# Patient Record
Sex: Male | Born: 1990 | Race: Black or African American | Hispanic: No | Marital: Single | Smoking: Former smoker
Health system: Southern US, Community
[De-identification: ages and names within clinical notes are randomized; demographics above are authoritative.]

## PROBLEM LIST (undated history)

## (undated) DIAGNOSIS — F419 Anxiety disorder, unspecified: Secondary | ICD-10-CM

## (undated) DIAGNOSIS — K4090 Unilateral inguinal hernia, without obstruction or gangrene, not specified as recurrent: Secondary | ICD-10-CM

## (undated) HISTORY — PX: HERNIA REPAIR: SHX51

## (undated) HISTORY — DX: Anxiety disorder, unspecified: F41.9

## (undated) HISTORY — PX: NO PAST SURGERIES: SHX2092

---

## 2013-09-03 ENCOUNTER — Encounter (HOSPITAL_COMMUNITY): Payer: Self-pay | Admitting: Emergency Medicine

## 2013-09-03 ENCOUNTER — Emergency Department (HOSPITAL_COMMUNITY)
Admission: EM | Admit: 2013-09-03 | Discharge: 2013-09-03 | Disposition: A | Discharge status: Home / Self Care | Payer: Self-pay | Attending: Emergency Medicine | Admitting: Emergency Medicine

## 2013-09-03 DIAGNOSIS — F419 Anxiety disorder, unspecified: Secondary | ICD-10-CM

## 2013-09-03 DIAGNOSIS — F17200 Nicotine dependence, unspecified, uncomplicated: Secondary | ICD-10-CM | POA: Insufficient documentation

## 2013-09-03 DIAGNOSIS — F411 Generalized anxiety disorder: Secondary | ICD-10-CM | POA: Insufficient documentation

## 2013-09-03 NOTE — ED Notes (Signed)
Pt calm and pleasant, verbalizes understanding of follow up with community  resources

## 2013-09-03 NOTE — ED Notes (Signed)
Pt recently given risperdal for anxiety.  Anxiety continues.

## 2013-09-03 NOTE — Discharge Instructions (Signed)
Stop your medication. Avoid any drugs or using medications off the street. Please follow up with psychiatrist, list provided.    Panic Attacks Panic attacks are sudden, short-livedsurges of severe anxiety, fear, or discomfort. They may occur for no reason when you are relaxed, when you are anxious, or when you are sleeping. Panic attacks may occur for a number of reasons:   Healthy people occasionally have panic attacks in extreme, life-threatening situations, such as war or natural disasters. Normal anxiety is a protective mechanism of the body that helps us react to danger (fight or flight response).  Panic attacks are often seen with anxiety disorders, such as panic disorder, social anxiety disorder, generalized anxiety disorder, and phobias. Anxiety disorders cause excessive or uncontrollable anxiety. They may interfere with your relationships or other life activities.  Panic attacks are sometimes seen with other mental illnesses, such as depression and posttraumatic stress disorder.  Certain medical conditions, prescription medicines, and drugs of abuse can cause panic attacks. SYMPTOMS  Panic attacks start suddenly, peak within 20 minutes, and are accompanied by four or more of the following symptoms:  Pounding heart or fast heart rate (palpitations).  Sweating.  Trembling or shaking.  Shortness of breath or feeling smothered.  Feeling choked.  Chest pain or discomfort.  Nausea or strange feeling in your stomach.  Dizziness, light-headedness, or feeling like you will faint.  Chills or hot flushes.  Numbness or tingling in your lips or hands and feet.  Feeling that things are not real or feeling that you are not yourself.  Fear of losing control or going crazy.  Fear of dying. Some of these symptoms can mimic serious medical conditions. For example, you may think you are having a heart attack. Although panic attacks can be very scary, they are not life  threatening. DIAGNOSIS  Panic attacks are diagnosed through an assessment by your health care provider. Your health care provider will ask questions about your symptoms, such as where and when they occurred. Your health care provider will also ask about your medical history and use of alcohol and drugs, including prescription medicines. Your health care provider may order blood tests or other studies to rule out a serious medical condition. Your health care provider may refer you to a mental health professional for further evaluation. TREATMENT   Most healthy people who have one or two panic attacks in an extreme, life-threatening situation will not require treatment.  The treatment for panic attacks associated with anxiety disorders or other mental illness typically involves counseling with a mental health professional, medicine, or a combination of both. Your health care provider will help determine what treatment is best for you.  Panic attacks due to physical illness usually go away with treatment of the illness. If prescription medicine is causing panic attacks, talk with your health care provider about stopping the medicine, decreasing the dose, or substituting another medicine.  Panic attacks due to alcohol or drug abuse go away with abstinence. Some adults need professional help in order to stop drinking or using drugs. HOME CARE INSTRUCTIONS   Take all medicines as directed by your health care provider.   Schedule and attend follow-up visits as directed by your health care provider. It is important to keep all your appointments. SEEK MEDICAL CARE IF:  You are not able to take your medicines as prescribed.  Your symptoms do not improve or get worse. SEEK IMMEDIATE MEDICAL CARE IF:   You experience panic attack symptoms that are different than  your usual symptoms.  You have serious thoughts about hurting yourself or others.  You are taking medicine for panic attacks and have a  serious side effect. MAKE SURE YOU:  Understand these instructions.  Will watch your condition.  Will get help right away if you are not doing well or get worse. Document Released: 01/02/2005 Document Revised: 01/07/2013 Document Reviewed: 08/16/2012 [facility] Patient Information 2015 [place], [place]. This information is not intended to replace advice given to you by your health care provider. Make sure you discuss any questions you have with your health care provider.

## 2013-09-03 NOTE — ED Provider Notes (Signed)
CSN: [id number]     Arrival date & time 09/03/13  1626 History  This chart was scribed for non-physician practitioner, Jaynie Crumbleatyana Natally Ribera, PA-C working with Merrie RoofJohn David Wofford III, MD by Greggory StallionKayla Andersen, ED scribe. This patient was seen in room WTR6/WTR6 and the patient's care was started at 6:08 PM.   Chief Complaint  Patient presents with  . Anxiety   The history is provided by the patient. No language interpreter was used.   HPI Comments: Rikki SpearingMalik Mcfetridge is a 23 y.o. male who presents to the Emergency Department complaining of continued anxiety. Pt states he placed on risperdal 7 days ago and it has provided no relief on his anxiety. States the medication "makes him crazy". [place] from [place], [place] in [place] and is now in [place]. States he will be here for a while. He is requesting a new medication for anxiety. Pt smokes marijuana but states he hasn't smoke any today. Denies other illicit drug use.   No past medical history on file. No past surgical history on file. No family history on file. History  Substance Use Topics  . Smoking status: Current Every Day Smoker  . Smokeless tobacco: Not on file  . Alcohol Use: Yes     Comment: rarely    Review of Systems  Psychiatric/Behavioral: The patient is nervous/anxious.   All other systems reviewed and are negative.  Allergies  Review of patient's allergies indicates no known allergies.  Home Medications   Prior to Admission medications   Not on File   BP 132/85  Pulse 112  Temp(Src) 98.4 F (36.9 C) (Oral)  Resp 16  SpO2 99%  Physical Exam  Nursing note and vitals reviewed. Constitutional: He is oriented to person, place, and time. He appears well-developed and well-nourished. No distress.  HENT:  Head: Normocephalic and atraumatic.  Eyes: Conjunctivae and EOM are normal.  Neck: Neck supple. No tracheal deviation present.  Cardiovascular: Normal rate.   Pulmonary/Chest: Effort normal. No respiratory  distress.  Musculoskeletal: Normal range of motion.  Neurological: He is alert and oriented to person, place, and time.  Skin: Skin is warm and dry.  Psychiatric: He has a normal mood and affect. His behavior is normal.    ED Course  Procedures (including critical care time)  DIAGNOSTIC STUDIES: Oxygen Saturation is 99% on RA, normal by my interpretation.    COORDINATION OF CARE: 6:11 PM-Advised pt that anxiety medications are not started in the ED. Discussed treatment plan which includes behavioral health referrals with pt at bedside and pt agreed to plan.   Labs Review Labs Reviewed  URINE RAPID DRUG SCREEN (HOSP PERFORMED)    Imaging Review No results found.   EKG Interpretation None      MDM   Final diagnoses:  Anxiety    Pt with complaint of generalized anxiety, none at present. At this time, no distress, actually just requesting a referral to a psychiatrist in town. Does not want medications. Will d/c home. Resource guide provided.   Filed Vitals:   09/03/13 1706  BP: 132/85  Pulse: 112  Temp: 98.4 F (36.9 C)  TempSrc: Oral  Resp: 16  SpO2: 99%    I personally performed the services described in this documentation, which was scribed in my presence. The recorded information has been reviewed and is accurate.  Lottie Musselatyana A Liddy Deam, PA-C 09/03/13 2102  Lottie Musselatyana A Antavia Tandy, PA-C 09/03/13 2103

## 2013-09-04 NOTE — ED Provider Notes (Signed)
Medical screening examination/treatment/procedure(s) were performed by non-physician practitioner and as supervising physician I was immediately available for consultation/collaboration.   EKG Interpretation None        Zachary Stewart David Jermale Crass III, MD 09/04/13 0019 

## 2013-09-28 ENCOUNTER — Emergency Department (HOSPITAL_COMMUNITY)
Admission: EM | Admit: 2013-09-28 | Discharge: 2013-09-28 | Disposition: A | Discharge status: Home / Self Care | Payer: Self-pay | Attending: Emergency Medicine | Admitting: Emergency Medicine

## 2013-09-28 ENCOUNTER — Encounter (HOSPITAL_COMMUNITY): Payer: Self-pay | Admitting: Emergency Medicine

## 2013-09-28 DIAGNOSIS — F17200 Nicotine dependence, unspecified, uncomplicated: Secondary | ICD-10-CM | POA: Insufficient documentation

## 2013-09-28 DIAGNOSIS — R112 Nausea with vomiting, unspecified: Secondary | ICD-10-CM | POA: Insufficient documentation

## 2013-09-28 DIAGNOSIS — R519 Headache, unspecified: Secondary | ICD-10-CM

## 2013-09-28 DIAGNOSIS — R51 Headache: Secondary | ICD-10-CM | POA: Insufficient documentation

## 2013-09-28 DIAGNOSIS — R1110 Vomiting, unspecified: Secondary | ICD-10-CM | POA: Insufficient documentation

## 2013-09-28 LAB — I-STAT CHEM 8, ED
BUN: 11 mg/dL (ref 6–23)
Calcium, Ion: 1.13 mmol/L (ref 1.12–1.23)
Chloride: 107 mEq/L (ref 96–112)
Creatinine, Ser: 1.1 mg/dL (ref 0.50–1.35)
Glucose, Bld: 114 mg/dL — ABNORMAL HIGH (ref 70–99)
HEMATOCRIT: 46 % (ref 39.0–52.0)
HEMOGLOBIN: 15.6 g/dL (ref 13.0–17.0)
Potassium: 4.3 mEq/L (ref 3.7–5.3)
SODIUM: 138 meq/L (ref 137–147)
TCO2: 25 mmol/L (ref 0–100)

## 2013-09-28 MED ORDER — SODIUM CHLORIDE 0.9 % IV BOLUS (SEPSIS)
1000.0000 mL | Freq: Once | INTRAVENOUS | Status: AC
  Administered 2013-09-28: 1000 mL via INTRAVENOUS

## 2013-09-28 MED ORDER — ONDANSETRON 4 MG PO TBDP: 4.0000 mg | ORAL_TABLET | Freq: Three times a day (TID) | ORAL | Status: DC | PRN

## 2013-09-28 MED ORDER — ACETAMINOPHEN 325 MG PO TABS
650.0000 mg | ORAL_TABLET | Freq: Once | ORAL | Status: AC
  Administered 2013-09-28: 650 mg via ORAL
  Filled 2013-09-28: qty 2

## 2013-09-28 MED ORDER — ONDANSETRON HCL 4 MG/2ML IJ SOLN
4.0000 mg | Freq: Once | INTRAMUSCULAR | Status: AC
  Administered 2013-09-28: 4 mg via INTRAVENOUS
  Filled 2013-09-28: qty 2

## 2013-09-28 NOTE — ED Notes (Signed)
Pt reports staying at a shelter, pt having headache that started last night after taking a sleeping pill. This am unable to eat due to n/v. Mask on pt at triage but no acute distress noted.

## 2013-09-28 NOTE — Discharge Instructions (Signed)
General Headache Without Cause °A headache is pain or discomfort felt around the head or neck area. The specific cause of a headache may not be found. There are many causes and types of headaches. A few common ones are: °· Tension headaches. °· Migraine headaches. °· Cluster headaches. °· Chronic daily headaches. °HOME CARE INSTRUCTIONS  °· Keep all follow-up appointments with your caregiver or any specialist referral. °· Only take over-the-counter or prescription medicines for pain or discomfort as directed by your caregiver. °· Lie down in a dark, quiet room when you have a headache. °· Keep a headache journal to find out what may trigger your migraine headaches. For example, write down: °¨ What you eat and drink. °¨ How much sleep you get. °¨ Any change to your diet or medicines. °· Try massage or other relaxation techniques. °· Put ice packs or heat on the head and neck. Use these 3 to 4 times per day for 15 to 20 minutes each time, or as needed. °· Limit stress. °· Sit up straight, and do not tense your muscles. °· Quit smoking if you smoke. °· Limit alcohol use. °· Decrease the amount of caffeine you drink, or stop drinking caffeine. °· Eat and sleep on a regular schedule. °· Get 7 to 9 hours of sleep, or as recommended by your caregiver. °· Keep lights dim if bright lights bother you and make your headaches worse. °SEEK MEDICAL CARE IF:  °· You have problems with the medicines you were prescribed. °· Your medicines are not working. °· You have a change from the usual headache. °· You have nausea or vomiting. °SEEK IMMEDIATE MEDICAL CARE IF:  °· Your headache becomes severe. °· You have a fever. °· You have a stiff neck. °· You have loss of vision. °· You have muscular weakness or loss of muscle control. °· You start losing your balance or have trouble walking. °· You feel faint or pass out. °· You have severe symptoms that are different from your first symptoms. °MAKE SURE YOU:  °· Understand these  instructions. °· Will watch your condition. °· Will get help right away if you are not doing well or get worse. °Document Released: 01/02/2005 Document Revised: 03/27/2011 Document Reviewed: 01/18/2011 °ExitCare® Patient Information ©2015 [employer]. This information is not intended to replace advice given to you by your health care provider. Make sure you discuss any questions you have with your health care provider. °Nausea and Vomiting °Nausea is a sick feeling that often comes before throwing up (vomiting). Vomiting is a reflex where stomach contents come out of your mouth. Vomiting can cause severe loss of body fluids (dehydration). Children and elderly adults can become dehydrated quickly, especially if they also have diarrhea. Nausea and vomiting are symptoms of a condition or disease. It is important to find the cause of your symptoms. °CAUSES  °· Direct irritation of the stomach lining. This irritation can result from increased acid production (gastroesophageal reflux disease), infection, food poisoning, taking certain medicines (such as nonsteroidal anti-inflammatory drugs), alcohol use, or tobacco use. °· Signals from the brain. These signals could be caused by a headache, heat exposure, an inner ear disturbance, increased pressure in the brain from injury, infection, a tumor, or a concussion, pain, emotional stimulus, or metabolic problems. °· An obstruction in the gastrointestinal tract (bowel obstruction). °· Illnesses such as diabetes, hepatitis, gallbladder problems, appendicitis, kidney problems, cancer, sepsis, atypical symptoms of a heart attack, or eating disorders. °· Medical treatments such as chemotherapy and   radiation. °· Receiving medicine that makes you sleep (general anesthetic) during surgery. °DIAGNOSIS °Your caregiver may ask for tests to be done if the problems do not improve after a few days. Tests may also be done if symptoms are severe or if the reason for the nausea and vomiting  is not clear. Tests may include: °· Urine tests. °· Blood tests. °· Stool tests. °· Cultures (to look for evidence of infection). °· X-rays or other imaging studies. °Test results can help your caregiver make decisions about treatment or the need for additional tests. °TREATMENT °You need to stay well hydrated. Drink frequently but in small amounts. You may wish to drink water, sports drinks, clear broth, or eat frozen ice pops or gelatin dessert to help stay hydrated. When you eat, eating slowly may help prevent nausea. There are also some antinausea medicines that may help prevent nausea. °HOME CARE INSTRUCTIONS  °· Take all medicine as directed by your caregiver. °· If you do not have an appetite, do not force yourself to eat. However, you must continue to drink fluids. °· If you have an appetite, eat a normal diet unless your caregiver tells you differently. °· Eat a variety of complex carbohydrates (rice, wheat, potatoes, bread), lean meats, yogurt, fruits, and vegetables. °· Avoid high-fat foods because they are more difficult to digest. °· Drink enough water and fluids to keep your urine clear or pale yellow. °· If you are dehydrated, ask your caregiver for specific rehydration instructions. Signs of dehydration may include: °· Severe thirst. °· Dry lips and mouth. °· Dizziness. °· Dark urine. °· Decreasing urine frequency and amount. °· Confusion. °· Rapid breathing or pulse. °SEEK IMMEDIATE MEDICAL CARE IF:  °· You have blood or brown flecks (like coffee grounds) in your vomit. °· You have black or bloody stools. °· You have a severe headache or stiff neck. °· You are confused. °· You have severe abdominal pain. °· You have chest pain or trouble breathing. °· You do not urinate at least once every 8 hours. °· You develop cold or clammy skin. °· You continue to vomit for longer than 24 to 48 hours. °· You have a fever. °MAKE SURE YOU:  °· Understand these instructions. °· Will watch your condition. °· Will  get help right away if you are not doing well or get worse. °Document Released: 01/02/2005 Document Revised: 03/27/2011 Document Reviewed: 06/01/2010 °ExitCare® Patient Information ©2015 [employer]. This information is not intended to replace advice given to you by your health care provider. Make sure you discuss any questions you have with your health care provider. ° °

## 2013-09-28 NOTE — ED Provider Notes (Signed)
CSN: [id number]     Arrival date & time 09/28/13  4098 History   First MD Initiated Contact with Patient 09/28/13 1025     Chief Complaint  Patient presents with  . Headache  . Emesis     (Consider location/radiation/quality/duration/timing/severity/associated sxs/prior Treatment) HPI Comments: Pt states that he is staying in a shelter and multiple people have been sick. He started with a headache last night and then developed vomiting this morning. No uri symptoms or fever. Hasn't taken anything for the the symptoms  The history is provided by the patient. No language interpreter was used.    History reviewed. No pertinent past medical history. History reviewed. No pertinent past surgical history. History reviewed. No pertinent family history. History  Substance Use Topics  . Smoking status: Current Every Day Smoker  . Smokeless tobacco: Not on file  . Alcohol Use: Yes     Comment: rarely    Review of Systems  Constitutional: Negative.   Respiratory: Negative.   Cardiovascular: Negative.       Allergies  Review of patient's allergies indicates no known allergies.  Home Medications   Prior to Admission medications   Not on File   BP 111/77  Pulse 68  Temp(Src) 97.5 F (36.4 C) (Oral)  Resp 18  SpO2 100% Physical Exam  Nursing note and vitals reviewed. Constitutional: He is oriented to person, place, and time. He appears well-developed and well-nourished.  HENT:  Head: Normocephalic and atraumatic.  Right Ear: External ear normal.  Left Ear: External ear normal.  Eyes: Conjunctivae and EOM are normal. Pupils are equal, round, and reactive to light.  Neck: Normal range of motion. Neck supple.  Cardiovascular: Normal rate and regular rhythm.   Pulmonary/Chest: Effort normal and breath sounds normal.  Musculoskeletal: Normal range of motion.  Neurological: He is alert and oriented to person, place, and time.  Skin: Skin is warm and dry.  Psychiatric: He has  a normal mood and affect.    ED Course  Procedures (including critical care time) Labs Review Labs Reviewed  I-STAT CHEM 8, ED - Abnormal; Notable for the following:    Glucose, Bld 114 (*)    All other components within normal limits    Imaging Review No results found.   EKG Interpretation None      MDM   Final diagnoses:  Nausea and vomiting, vomiting of unspecified type  Headache, unspecified headache type    Pt tolerating po without any problem. Pt is feeling better. Asking tylenol for headache. Will send home on zofran    Teressa Lower, NP 09/28/13 1231

## 2013-10-01 NOTE — ED Provider Notes (Signed)
Medical screening examination/treatment/procedure(s) were performed by non-physician practitioner and as supervising physician I was immediately available for consultation/collaboration.   EKG Interpretation None        Samuel Jester, DO 10/01/13 [phone]

## 2013-10-19 ENCOUNTER — Encounter (HOSPITAL_COMMUNITY): Payer: Self-pay | Admitting: Emergency Medicine

## 2013-10-19 ENCOUNTER — Emergency Department (HOSPITAL_COMMUNITY)
Admission: EM | Admit: 2013-10-19 | Discharge: 2013-10-19 | Disposition: A | Discharge status: Home / Self Care | Payer: Self-pay | Attending: Emergency Medicine | Admitting: Emergency Medicine

## 2013-10-19 DIAGNOSIS — J22 Unspecified acute lower respiratory infection: Secondary | ICD-10-CM

## 2013-10-19 DIAGNOSIS — Z720 Tobacco use: Secondary | ICD-10-CM | POA: Insufficient documentation

## 2013-10-19 DIAGNOSIS — F1210 Cannabis abuse, uncomplicated: Secondary | ICD-10-CM | POA: Insufficient documentation

## 2013-10-19 DIAGNOSIS — J00 Acute nasopharyngitis [common cold]: Secondary | ICD-10-CM | POA: Insufficient documentation

## 2013-10-19 MED ORDER — PE-DM-GG-APAP&PE-DOXYL-DM-APAP PO LQPK: 1.0000 | Freq: Four times a day (QID) | ORAL | Status: DC | PRN

## 2013-10-19 NOTE — ED Notes (Signed)
Pt. Stated, I noticed this morning I have this bump on my chest and It hurts and I use to have diabetes.

## 2013-10-19 NOTE — ED Provider Notes (Signed)
CSN: [id number]     Arrival date & time 10/19/13  1307 History   First MD Initiated Contact with Patient 10/19/13 1324     Chief Complaint  Patient presents with  . Chest Pain    pt. statd, I have a bump on my chest  . Cough     (Consider location/radiation/quality/duration/timing/severity/associated sxs/prior Treatment) HPI   Zachary Stewart is a 23 y.o. male who complains of dry cough and and pain when breathing in cold air for 2 days. He denies a history of anorexia, chest pain, chills, dizziness, fatigue, fevers, myalgias, nausea, rash, shortness of breath, sweats, vomiting, weakness, weight loss, wheezing and sputum production and denies a history of asthma. Patient admits to daily cigarette and marijuana use. He has not taken anything for his cough.      History reviewed. No pertinent past medical history. History reviewed. No pertinent past surgical history. No family history on file. History  Substance Use Topics  . Smoking status: Current Every Day Smoker  . Smokeless tobacco: Not on file  . Alcohol Use: Yes     Comment: rarely    Review of Systems  Constitutional: Negative for fever, chills, activity change, appetite change, fatigue and unexpected weight change.  HENT: Negative for congestion, facial swelling, sinus pressure and trouble swallowing.   Respiratory: Positive for cough. Negative for apnea, choking, chest tightness, shortness of breath, wheezing and stridor.   Gastrointestinal: Negative for nausea and vomiting.  Skin: Negative for rash.      Allergies  Review of patient's allergies indicates no known allergies.  Home Medications   Prior to Admission medications   Medication Sig Start Date End Date Taking? Authorizing Provider  ondansetron (ZOFRAN ODT) 4 MG disintegrating tablet Take 1 tablet (4 mg total) by mouth every 8 (eight) hours as needed for nausea or vomiting. 09/28/13   Teressa LowerVrinda Pickering, NP   BP 122/79  Pulse 60  Temp(Src) 97.8 F (36.6 C)  (Oral)  Resp 20  SpO2 100% Physical Exam  Nursing note and vitals reviewed. Constitutional: He appears well-developed and well-nourished. No distress.  HENT:  Head: Normocephalic and atraumatic.  Eyes: Conjunctivae are normal. No scleral icterus.  Neck: Normal range of motion. Neck supple.  Cardiovascular: Normal rate, regular rhythm and normal heart sounds.   Pulmonary/Chest: Effort normal and breath sounds normal. No respiratory distress.  Abdominal: Soft. There is no tenderness.  Musculoskeletal: He exhibits no edema.  Neurological: He is alert.  Skin: Skin is warm and dry. He is not diaphoretic.  Psychiatric: His behavior is normal.    ED Course  Procedures (including critical care time) Labs Review Labs Reviewed  CBG MONITORING, ED    Imaging Review No results found.   EKG Interpretation None      MDM   Final diagnoses:  Chest cold  Tobacco abuse  Marijuana abuse    Patients symptoms are consistent with URI, likely viral etiology. Discussed that antibiotics are not indicated for viral infections. Pt will be discharged with symptomatic treatment.  Verbalizes understanding and is agreeable with plan. Pt is hemodynamically stable & in NAD prior to dc.     Zachary CaptainAbigail Cordia Miklos, PA-C 10/20/13 1259

## 2013-10-19 NOTE — Discharge Instructions (Signed)

## 2013-10-22 NOTE — ED Provider Notes (Signed)
Medical screening examination/treatment/procedure(s) were performed by non-physician practitioner and as supervising physician I was immediately available for consultation/collaboration.   EKG Interpretation None        Trey Ala Capri, MD 10/22/13 0633 

## 2014-02-25 ENCOUNTER — Emergency Department (INDEPENDENT_AMBULATORY_CARE_PROVIDER_SITE_OTHER)
Admission: EM | Admit: 2014-02-25 | Discharge: 2014-02-25 | Disposition: A | Discharge status: Home / Self Care | Payer: Self-pay | Source: Home / Self Care | Attending: Family Medicine | Admitting: Family Medicine

## 2014-02-25 ENCOUNTER — Other Ambulatory Visit (HOSPITAL_COMMUNITY)
Admission: RE | Admit: 2014-02-25 | Discharge: 2014-02-25 | Disposition: A | Discharge status: Home / Self Care | Payer: Self-pay | Source: Ambulatory Visit | Attending: Family Medicine | Admitting: Family Medicine

## 2014-02-25 ENCOUNTER — Encounter (HOSPITAL_COMMUNITY): Payer: Self-pay | Admitting: Emergency Medicine

## 2014-02-25 DIAGNOSIS — Z113 Encounter for screening for infections with a predominantly sexual mode of transmission: Secondary | ICD-10-CM | POA: Insufficient documentation

## 2014-02-25 DIAGNOSIS — K4090 Unilateral inguinal hernia, without obstruction or gangrene, not specified as recurrent: Secondary | ICD-10-CM

## 2014-02-25 DIAGNOSIS — N342 Other urethritis: Secondary | ICD-10-CM

## 2014-02-25 NOTE — ED Notes (Signed)
Pt would like to be checked out for STD's as he has had some unprotected sex recently.  He denies any symptoms at this time.

## 2014-02-25 NOTE — ED Provider Notes (Signed)
Zachary Stewart is a 24 y.o. male who presents to Urgent Care today for STD test. Patient is asymptomatic but has had unprotected sex recently. He would like STD testing. He feels well otherwise. \When asked he notes a bulging in his right inguinal area. It is been present for 3 years and does not hurt.   History reviewed. No pertinent past medical history. History reviewed. No pertinent past surgical history. History  Substance Use Topics  . Smoking status: Current Every Day Smoker  . Smokeless tobacco: Not on file  . Alcohol Use: Yes     Comment: rarely   ROS as above Medications: No current facility-administered medications for this encounter.   Current Outpatient Prescriptions  Medication Sig Dispense Refill  . PE-DM-GG-APAP&PE-Doxyl-DM-APAP (VICKS DAYQUIL/NYQUIL SEVERE) LQPK Take 1-2 capsules by mouth every 6 (six) hours as needed. 1 Package 0   No Known Allergies   Exam:  BP 126/90 mmHg  Pulse 58  Temp(Src) 98.6 F (37 C) (Oral)  SpO2 98% Gen: Well NAD Genitals. No inguinal lymphadenopathy. Slight bulging in the right inguinal area retractable with pressure. Slight hernia present at the external inguinal ring. Reducible. Nontender Testicles are descended bilaterally and nontender. Penis is normal appearing without lesions. No discharge.  No results found for this or any previous visit (from the past 24 hour(s)). No results found.  Assessment and Plan: 24 y.o. male with urine cytology for gonorrhea Chlamydia Trichomonas as well as serology for HIV and syphilis pending. Patient additionally has a inguinal hernia. Recommend follow-up with general surgery for this issue.  Discussed warning signs or symptoms. Please see discharge instructions. Patient expresses understanding.     Rodolph BongEvan S Josiane Labine, MD 02/25/14 [phone]

## 2014-02-25 NOTE — Discharge Instructions (Signed)
Thank you for coming in today. Return as needed. I will call you if labs come back positive.    Urethritis Urethritis is an inflammation of the tube through which urine exits your bladder (urethra).  CAUSES Urethritis is often caused by an infection in your urethra. The infection can be viral, like herpes. The infection can also be bacterial, like gonorrhea. RISK FACTORS Risk factors of urethritis include:  Having sex without using a condom.  Having multiple sexual partners.  Having poor hygiene. SIGNS AND SYMPTOMS Symptoms of urethritis are less noticeable in women than in men. These symptoms include:  Burning feeling when you urinate (dysuria).  Discharge from your urethra.  Blood in your urine (hematuria).  Urinating more than usual. DIAGNOSIS  To confirm a diagnosis of urethritis, your health care provider will do the following:  Ask about your sexual history.  Perform a physical exam.  Have you provide a sample of your urine for lab testing.  Use a cotton swab to gently collect a sample from your urethra for lab testing. TREATMENT  It is important to treat urethritis. Depending on the cause, untreated urethritis may lead to serious genital infections and possibly infertility. Urethritis caused by a bacterial infection is treated with antibiotic medicine. All sexual partners must be treated.  HOME CARE INSTRUCTIONS  Do not have sex until the test results are known and treatment is completed, even if your symptoms go away before you finish treatment.  If you were prescribed an antibiotic, finish it all even if you start to feel better. SEEK MEDICAL CARE IF:   Your symptoms are not improved in 3 days.  Your symptoms are getting worse.  You develop abdominal pain or pelvic pain (in women).  You develop joint pain.  You have a fever. SEEK IMMEDIATE MEDICAL CARE IF:   You have severe pain in the belly, back, or side.  You have repeated vomiting. MAKE SURE  YOU:  Understand these instructions.  Will watch your condition.  Will get help right away if you are not doing well or get worse. Document Released: 06/28/2000 Document Revised: 05/19/2013 Document Reviewed: 09/02/2012 [facility] Patient Information 2015 [place], [place]. This information is not intended to replace advice given to you by your health care provider. Make sure you discuss any questions you have with your health care provider.    Inguinal Hernia, Adult Muscles help keep everything in the body in its proper place. But if a weak spot in the muscles develops, something can poke through. That is called a hernia. When this happens in the lower part of the belly (abdomen), it is called an inguinal hernia. (It takes its name from a part of the body in this region called the inguinal canal.) A weak spot in the wall of muscles lets some fat or part of the small intestine bulge through. An inguinal hernia can develop at any age. Men get them more often than women. CAUSES  In adults, an inguinal hernia develops over time.  It can be triggered by:  Suddenly straining the muscles of the lower abdomen.  Lifting heavy objects.  Straining to have a bowel movement. Difficult bowel movements (constipation) can lead to this.  Constant coughing. This may be caused by smoking or lung disease.  Being overweight.  Being pregnant.  Working at a job that requires long periods of standing or heavy lifting.  Having had an inguinal hernia before. One type can be an emergency situation. It is called a strangulated inguinal  hernia. It develops if part of the small intestine slips through the weak spot and cannot get back into the abdomen. The blood supply can be cut off. If that happens, part of the intestine may die. This situation requires emergency surgery. SYMPTOMS  Often, a small inguinal hernia has no symptoms. It is found when a healthcare provider does a physical exam. Larger hernias usually  have symptoms.   In adults, symptoms may include:  A lump in the groin. This is easier to see when the person is standing. It might disappear when lying down.  In men, a lump in the scrotum.  Pain or burning in the groin. This occurs especially when lifting, straining or coughing.  A dull ache or feeling of pressure in the groin.  Signs of a strangulated hernia can include:  A bulge in the groin that becomes very painful and tender to the touch.  A bulge that turns red or purple.  Fever, nausea and vomiting.  Inability to have a bowel movement or to pass gas. DIAGNOSIS  To decide if you have an inguinal hernia, a healthcare provider will probably do a physical examination.  This will include asking questions about any symptoms you have noticed.  The healthcare provider might feel the groin area and ask you to cough. If an inguinal hernia is felt, the healthcare provider may try to slide it back into the abdomen.  Usually no other tests are needed. TREATMENT  Treatments can vary. The size of the hernia makes a difference. Options include:  Watchful waiting. This is often suggested if the hernia is small and you have had no symptoms.  No medical procedure will be done unless symptoms develop.  You will need to watch closely for symptoms. If any occur, contact your healthcare provider right away.  Surgery. This is used if the hernia is larger or you have symptoms.  Open surgery. This is usually an outpatient procedure (you will not stay overnight in a hospital). An cut (incision) is made through the skin in the groin. The hernia is put back inside the abdomen. The weak area in the muscles is then repaired by herniorrhaphy or hernioplasty. Herniorrhaphy: in this type of surgery, the weak muscles are sewn back together. Hernioplasty: a patch or mesh is used to close the weak area in the abdominal wall.  Laparoscopy. In this procedure, a surgeon makes small incisions. A thin tube  with a tiny video camera (called a laparoscope) is put into the abdomen. The surgeon repairs the hernia with mesh by looking with the video camera and using two long instruments. HOME CARE INSTRUCTIONS   After surgery to repair an inguinal hernia:  You will need to take pain medicine prescribed by your healthcare provider. Follow all directions carefully.  You will need to take care of the wound from the incision.  Your activity will be restricted for awhile. This will probably include no heavy lifting for several weeks. You also should not do anything too active for a few weeks. When you can return to work will depend on the type of job that you have.  During "watchful waiting" periods, you should:  Maintain a healthy weight.  Eat a diet high in fiber (fruits, vegetables and whole grains).  Drink plenty of fluids to avoid constipation. This means drinking enough water and other liquids to keep your urine clear or pale yellow.  Do not lift heavy objects.  Do not stand for long periods of time.  Quit  smoking. This should keep you from developing a frequent cough. SEEK MEDICAL CARE IF:   A bulge develops in your groin area.  You feel pain, a burning sensation or pressure in the groin. This might be worse if you are lifting or straining.  You develop a fever of more than 100.5 F (38.1 C). SEEK IMMEDIATE MEDICAL CARE IF:   Pain in the groin increases suddenly.  A bulge in the groin gets bigger suddenly and does not go down.  For men, there is sudden pain in the scrotum. Or, the size of the scrotum increases.  A bulge in the groin area becomes red or purple and is painful to touch.  You have nausea or vomiting that does not go away.  You feel your heart beating much faster than normal.  You cannot have a bowel movement or pass gas.  You develop a fever of more than 102.0 F (38.9 C). Document Released: 05/21/2008 Document Revised: 03/27/2011 Document Reviewed:  05/21/2008 [facility] Patient Information 2015 [place], [place]. This information is not intended to replace advice given to you by your health care provider. Make sure you discuss any questions you have with your health care provider.

## 2014-02-26 LAB — URINE CYTOLOGY ANCILLARY ONLY
CHLAMYDIA, DNA PROBE: NEGATIVE
Neisseria Gonorrhea: NEGATIVE
Trichomonas: NEGATIVE

## 2014-02-27 LAB — RPR: RPR Ser Ql: NONREACTIVE

## 2014-02-27 LAB — HIV ANTIBODY (ROUTINE TESTING W REFLEX): HIV SCREEN 4TH GENERATION: NONREACTIVE

## 2014-08-06 ENCOUNTER — Other Ambulatory Visit (HOSPITAL_COMMUNITY)
Admission: RE | Admit: 2014-08-06 | Discharge: 2014-08-06 | Disposition: A | Discharge status: Home / Self Care | Payer: Self-pay | Source: Ambulatory Visit | Attending: Family Medicine | Admitting: Family Medicine

## 2014-08-06 ENCOUNTER — Emergency Department (INDEPENDENT_AMBULATORY_CARE_PROVIDER_SITE_OTHER)
Admission: EM | Admit: 2014-08-06 | Discharge: 2014-08-06 | Disposition: A | Discharge status: Home / Self Care | Payer: Self-pay | Source: Home / Self Care | Attending: Family Medicine | Admitting: Family Medicine

## 2014-08-06 ENCOUNTER — Encounter (HOSPITAL_COMMUNITY): Payer: Self-pay | Admitting: Emergency Medicine

## 2014-08-06 DIAGNOSIS — Z113 Encounter for screening for infections with a predominantly sexual mode of transmission: Secondary | ICD-10-CM | POA: Insufficient documentation

## 2014-08-06 DIAGNOSIS — R369 Urethral discharge, unspecified: Secondary | ICD-10-CM

## 2014-08-06 DIAGNOSIS — Z202 Contact with and (suspected) exposure to infections with a predominantly sexual mode of transmission: Secondary | ICD-10-CM

## 2014-08-06 DIAGNOSIS — Z7251 High risk heterosexual behavior: Secondary | ICD-10-CM

## 2014-08-06 LAB — POCT URINALYSIS DIP (DEVICE)
Bilirubin Urine: NEGATIVE
Glucose, UA: NEGATIVE mg/dL
Hgb urine dipstick: NEGATIVE
Ketones, ur: NEGATIVE mg/dL
Leukocytes, UA: NEGATIVE
NITRITE: NEGATIVE
Protein, ur: 30 mg/dL — AB
SPECIFIC GRAVITY, URINE: 1.025 (ref 1.005–1.030)
Urobilinogen, UA: 0.2 mg/dL (ref 0.0–1.0)
pH: 7 (ref 5.0–8.0)

## 2014-08-06 MED ORDER — CEFTRIAXONE SODIUM 250 MG IJ SOLR
250.0000 mg | Freq: Once | INTRAMUSCULAR | Status: AC
  Administered 2014-08-06: 250 mg via INTRAMUSCULAR

## 2014-08-06 MED ORDER — AZITHROMYCIN 250 MG PO TABS
ORAL_TABLET | ORAL | Status: AC
  Filled 2014-08-06: qty 4

## 2014-08-06 MED ORDER — CEFTRIAXONE SODIUM 250 MG IJ SOLR
INTRAMUSCULAR | Status: AC
  Filled 2014-08-06: qty 250

## 2014-08-06 MED ORDER — AZITHROMYCIN 250 MG PO TABS
ORAL_TABLET | ORAL | Status: AC
  Filled 2014-08-06: qty 1

## 2014-08-06 MED ORDER — AZITHROMYCIN 250 MG PO TABS
1000.0000 mg | ORAL_TABLET | Freq: Once | ORAL | Status: AC
  Administered 2014-08-06: 1000 mg via ORAL

## 2014-08-06 NOTE — Discharge Instructions (Signed)
Safe Sex °Safe sex is about reducing the risk of giving or getting a sexually transmitted disease (STD). STDs are spread through sexual contact involving the genitals, mouth, or rectum. Some STDs can be cured and others cannot. Safe sex can also prevent unintended pregnancies.  °WHAT ARE SOME SAFE SEX PRACTICES? °· Limit your sexual activity to only one partner who is having sex with only you. °· Talk to your partner about his or her past partners, past STDs, and drug use. °· Use a condom every time you have sexual intercourse. This includes vaginal, oral, and anal sexual activity. Both females and males should wear condoms during oral sex. Only use latex or polyurethane condoms and water-based lubricants. Using petroleum-based lubricants or oils to lubricate a condom will weaken the condom and increase the chance that it will break. The condom should be in place from the beginning to the end of sexual activity. Wearing a condom reduces, but does not completely eliminate, your risk of getting or giving an STD. STDs can be spread by contact with infected body fluids and skin. °· Get vaccinated for hepatitis B and HPV. °· Avoid alcohol and recreational drugs, which can affect your judgment. You may forget to use a condom or participate in high-risk sex. °· For females, avoid douching after sexual intercourse. Douching can spread an infection farther into the reproductive tract. °· Check your body for signs of sores, blisters, rashes, or unusual discharge. See your health care provider if you notice any of these signs. °· Avoid sexual contact if you have symptoms of an infection or are being treated for an STD. If you or your partner has herpes, avoid sexual contact when blisters are present. Use condoms at all other times. °· If you are at risk of being infected with HIV, it is recommended that you take a prescription medicine daily to prevent HIV infection. This is called pre-exposure prophylaxis (PrEP). You are  considered at risk if: °¨ You are a man who has sex with other men (MSM). °¨ You are a heterosexual man or woman who is sexually active with more than one partner. °¨ You take drugs by injection. °¨ You are sexually active with a partner who has HIV. °· Talk with your health care provider about whether you are at high risk of being infected with HIV. If you choose to begin PrEP, you should first be tested for HIV. You should then be tested every 3 months for as long as you are taking PrEP. °· See your health care provider for regular screenings, exams, and tests for other STDs. Before having sex with a new partner, each of you should be screened for STDs and should talk about the results with each other. °WHAT ARE THE BENEFITS OF SAFE SEX?  °· There is less chance of getting or giving an STD. °· You can prevent unwanted or unintended pregnancies. °· By discussing safe sex concerns with your partner, you may increase feelings of intimacy, comfort, trust, and honesty between the two of you. °Document Released: 02/10/2004 Document Revised: 05/19/2013 Document Reviewed: 06/26/2011 °ExitCare® Patient Information ©2015 [employer]. This information is not intended to replace advice given to you by your health care provider. Make sure you discuss any questions you have with your health care provider. ° °Sexually Transmitted Disease °A sexually transmitted disease (STD) is a disease or infection that may be passed (transmitted) from person to person, usually during sexual activity. This may happen by way of saliva, semen, blood,   vaginal mucus, or urine. Common STDs include:  °· Gonorrhea.   °· Chlamydia.   °· Syphilis.   °· HIV and AIDS.   °· Genital herpes.   °· Hepatitis B and C.   °· Trichomonas.   °· Human papillomavirus (HPV).   °· Pubic lice.   °· Scabies. °· Mites. °· Bacterial vaginosis. °WHAT ARE CAUSES OF STDs? °An STD may be caused by bacteria, a virus, or parasites. STDs are often transmitted during sexual  activity if one person is infected. However, they may also be transmitted through nonsexual means. STDs may be transmitted after:  °· Sexual intercourse with an infected person.   °· Sharing sex toys with an infected person.   °· Sharing needles with an infected person or using unclean piercing or tattoo needles. °· Having intimate contact with the genitals, mouth, or rectal areas of an infected person.   °· Exposure to infected fluids during birth. °WHAT ARE THE SIGNS AND SYMPTOMS OF STDs? °Different STDs have different symptoms. Some people may not have any symptoms. If symptoms are present, they may include:  °· Painful or bloody urination.   °· Pain in the pelvis, abdomen, vagina, anus, throat, or eyes.   °· A skin rash, itching, or irritation. °· Growths, ulcerations, blisters, or sores in the genital and anal areas. °· Abnormal vaginal discharge with or without bad odor.   °· Penile discharge in men.   °· Fever.   °· Pain or bleeding during sexual intercourse.   °· Swollen glands in the groin area.   °· Yellow skin and eyes (jaundice). This is seen with hepatitis.   °· Swollen testicles. °· Infertility. °· Sores and blisters in the mouth. °HOW ARE STDs DIAGNOSED? °To make a diagnosis, your health care provider may:  °· Take a medical history.   °· Perform a physical exam.   °· Take a sample of any discharge to examine. °· Swab the throat, cervix, opening to the penis, rectum, or vagina for testing. °· Test a sample of your first morning urine.   °· Perform blood tests.   °· Perform a Pap test, if this applies.   °· Perform a colposcopy.   °· Perform a laparoscopy.   °HOW ARE STDs TREATED? ° Treatment depends on the STD. Some STDs may be treated but not cured.  °· Chlamydia, gonorrhea, trichomonas, and syphilis can be cured with antibiotic medicine.   °· Genital herpes, hepatitis, and HIV can be treated, but not cured, with prescribed medicines. The medicines lessen symptoms.   °· Genital warts from HPV can be  treated with medicine or by freezing, burning (electrocautery), or surgery. Warts may come back.   °· HPV cannot be cured with medicine or surgery. However, abnormal areas may be removed from the cervix, vagina, or vulva.   °· If your diagnosis is confirmed, your recent sexual partners need treatment. This is true even if they are symptom-free or have a negative culture or evaluation. They should not have sex until their health care providers say it is okay. °HOW CAN I REDUCE MY RISK OF GETTING AN STD? °Take these steps to reduce your risk of getting an STD: °· Use latex condoms, dental dams, and water-soluble lubricants during sexual activity. Do not use petroleum jelly or oils. °· Avoid having multiple sex partners. °· Do not have sex with someone who has other sex partners. °· Do not have sex with anyone you do not know or who is at high risk for an STD. °· Avoid risky sex practices that can break your skin. °· Do not have sex if you have open sores on your mouth or skin. °· Avoid drinking too   much alcohol or taking illegal drugs. Alcohol and drugs can affect your judgment and put you in a vulnerable position. °· Avoid engaging in oral and anal sex acts. °· Get vaccinated for HPV and hepatitis. If you have not received these vaccines in the past, talk to your health care provider about whether one or both might be right for you.   °· If you are at risk of being infected with HIV, it is recommended that you take a prescription medicine daily to prevent HIV infection. This is called pre-exposure prophylaxis (PrEP). You are considered at risk if: °¨ You are a man who has sex with other men (MSM). °¨ You are a heterosexual man or woman and are sexually active with more than one partner. °¨ You take drugs by injection. °¨ You are sexually active with a partner who has HIV. °· Talk with your health care provider about whether you are at high risk of being infected with HIV. If you choose to begin PrEP, you should first  be tested for HIV. You should then be tested every 3 months for as long as you are taking PrEP.   °WHAT SHOULD I DO IF I THINK I HAVE AN STD? °· See your health care provider.   °· Tell your sexual partner(s). They should be tested and treated for any STDs. °· Do not have sex until your health care provider says it is okay.  °WHEN SHOULD I GET IMMEDIATE MEDICAL CARE? °Contact your health care provider right away if:  °· You have severe abdominal pain. °· You are a man and notice swelling or pain in your testicles. °· You are a woman and notice swelling or pain in your vagina. °Document Released: 03/25/2002 Document Revised: 01/07/2013 Document Reviewed: 07/23/2012 °ExitCare® Patient Information ©2015 [employer]. This information is not intended to replace advice given to you by your health care provider. Make sure you discuss any questions you have with your health care provider. ° °

## 2014-08-06 NOTE — ED Notes (Signed)
Pt states that he has had penile discharge for a few days after having sexual relations with his partner

## 2014-08-06 NOTE — ED Provider Notes (Signed)
CSN: [id number]     Arrival date & time 08/06/14  1617 History   First MD Initiated Contact with Patient 08/06/14 1649     Chief Complaint  Patient presents with  . Exposure to STD   (Consider location/radiation/quality/duration/timing/severity/associated sxs/prior Treatment) HPI Comments: Complains of a penile discharge for several days. He states he " is not sure which one it was a girls gave him the STD as he has had intercourse with one and oral sex with 2 others".  Denies lesions or pain.   History reviewed. No pertinent past medical history. History reviewed. No pertinent past surgical history. History reviewed. No pertinent family history. History  Substance Use Topics  . Smoking status: Former Games developer  . Smokeless tobacco: Not on file  . Alcohol Use: Yes     Comment: rarely    Review of Systems  Constitutional: Negative.   Genitourinary: Positive for discharge. Negative for dysuria, urgency, flank pain, genital sores and testicular pain.  All other systems reviewed and are negative.   Allergies  Review of patient's allergies indicates no known allergies.  Home Medications   Prior to Admission medications   Medication Sig Start Date End Date Taking? Authorizing Provider  PE-DM-GG-APAP&PE-Doxyl-DM-APAP (VICKS DAYQUIL/NYQUIL SEVERE) LQPK Take 1-2 capsules by mouth every 6 (six) hours as needed. 10/19/13   Abigail Harris, PA-C   BP 129/55 mmHg  Pulse 83  Temp(Src) 98.8 F (37.1 C) (Oral)  Resp 20  SpO2 97% Physical Exam  Constitutional: He is oriented to person, place, and time. He appears well-developed and well-nourished. No distress.  Neck: Normal range of motion. Neck supple.  Pulmonary/Chest: Effort normal. No respiratory distress.  Musculoskeletal: Normal range of motion.  Neurological: He is alert and oriented to person, place, and time.  Skin: Skin is warm and dry.  Psychiatric: He has a normal mood and affect.  Nursing note and vitals reviewed.   ED  Course  Procedures (including critical care time) Labs Review Labs Reviewed  POCT URINALYSIS DIP (DEVICE) - Abnormal; Notable for the following:    Protein, ur 30 (*)    All other components within normal limits  URINE CYTOLOGY ANCILLARY ONLY    Imaging Review No results found.   MDM   1. Penile discharge   2. Problems related to high-risk sexual behavior   3. Exposure to STD    Rocephin 250 mg IM Azithromycin 1gm po Urine cytolgy pending.    Hayden Rasmussen, NP 08/06/14 1734

## 2014-08-07 LAB — URINE CYTOLOGY ANCILLARY ONLY
Chlamydia: NEGATIVE
Neisseria Gonorrhea: NEGATIVE
TRICH (WINDOWPATH): NEGATIVE

## 2014-08-07 NOTE — ED Notes (Signed)
Final lab report negative for GC, chlamydia, trichomonas

## 2017-02-21 ENCOUNTER — Ambulatory Visit (HOSPITAL_COMMUNITY)
Admission: EM | Admit: 2017-02-21 | Discharge: 2017-02-21 | Disposition: A | Discharge status: Home / Self Care | Payer: Self-pay | Attending: Family Medicine | Admitting: Family Medicine

## 2017-02-21 ENCOUNTER — Encounter (HOSPITAL_COMMUNITY): Payer: Self-pay | Admitting: Emergency Medicine

## 2017-02-21 ENCOUNTER — Other Ambulatory Visit: Payer: Self-pay

## 2017-02-21 DIAGNOSIS — Z113 Encounter for screening for infections with a predominantly sexual mode of transmission: Secondary | ICD-10-CM

## 2017-02-21 DIAGNOSIS — Z202 Contact with and (suspected) exposure to infections with a predominantly sexual mode of transmission: Secondary | ICD-10-CM | POA: Insufficient documentation

## 2017-02-21 DIAGNOSIS — R350 Frequency of micturition: Secondary | ICD-10-CM | POA: Insufficient documentation

## 2017-02-21 LAB — POCT URINALYSIS DIP (DEVICE)
Bilirubin Urine: NEGATIVE
Glucose, UA: NEGATIVE mg/dL
Hgb urine dipstick: NEGATIVE
Ketones, ur: NEGATIVE mg/dL
Leukocytes, UA: NEGATIVE
Nitrite: NEGATIVE
PH: 7 (ref 5.0–8.0)
Protein, ur: NEGATIVE mg/dL
SPECIFIC GRAVITY, URINE: 1.02 (ref 1.005–1.030)
Urobilinogen, UA: 0.2 mg/dL (ref 0.0–1.0)

## 2017-02-21 NOTE — Discharge Instructions (Signed)
We have sent testing for sexually transmitted infections. We will notify you of any positive results once they are received. If required, we will prescribe any medications you might need. °

## 2017-02-21 NOTE — ED Triage Notes (Signed)
Pt states "these past few days every time I drink something I have to use the bathroom, i've been irritated down there". Pt denies penile discharge. Pt states he needs to be "checked out for bacteria". Pt endorses frequency with urination.

## 2017-02-22 LAB — URINE CYTOLOGY ANCILLARY ONLY
Chlamydia: NEGATIVE
Neisseria Gonorrhea: NEGATIVE
TRICH (WINDOWPATH): NEGATIVE

## 2017-02-22 NOTE — ED Provider Notes (Signed)
  [facility]    ASSESSMENT & PLAN:  1. Urinary frequency   2. Screening for STD (sexually transmitted disease)    U/A normal. Symptoms very likely due to increased caffeine intake. He will decrease. Urine cytology sent at his request. No empiric treatment.  Outlined signs and symptoms indicating need for more acute intervention. Patient verbalized understanding. After Visit Summary given.  SUBJECTIVE:  Zachary Stewart is a 27 y.o. male who complains of urinary urgency for the past several days; questions longer. No flank pain, fever, chills, abnormal penile discharge or bleeding. Hematuria: not present. Normal PO intake. No flank or abdominal pain. No self treatment. Ambulatory without difficulty. Drinking more "energy drinks" lately. No dysuria.  LMP: No LMP for male patient.  ROS: As in HPI.  OBJECTIVE:  Vitals:   02/21/17 1640  BP: (!) 130/93  Pulse: (!) 56  Resp: 18  Temp: 98.4 F (36.9 C)  SpO2: 100%   Appears well, in no apparent distress. Abdomen is soft without tenderness, guarding, mass, rebound or organomegaly. No CVA tenderness or inguinal adenopathy noted.  Labs Reviewed  POCT URINALYSIS DIP (DEVICE)  URINE CYTOLOGY ANCILLARY ONLY    No Known Allergies   Social History   Socioeconomic History  . Marital status: Single    Spouse name: Not on file  . Number of children: Not on file  . Years of education: Not on file  . Highest education level: Not on file  Social Needs  . Financial resource strain: Not on file  . Food insecurity - worry: Not on file  . Food insecurity - inability: Not on file  . Transportation needs - medical: Not on file  . Transportation needs - non-medical: Not on file  Occupational History  . Not on file  Tobacco Use  . Smoking status: Former Smoker  Substance and Sexual Activity  . Alcohol use: Yes    Comment: rarely  . Drug use: No  . Sexual activity: Yes    Birth control/protection: None  Other Topics  Concern  . Not on file  Social History Narrative  . Not on file   No family history on file.     Mardella LaymanHagler, Ulani Degrasse, MD 02/22/17 [phone]

## 2017-09-18 ENCOUNTER — Encounter (HOSPITAL_COMMUNITY): Payer: Self-pay | Admitting: Emergency Medicine

## 2017-09-18 ENCOUNTER — Emergency Department (HOSPITAL_COMMUNITY)
Admission: EM | Admit: 2017-09-18 | Discharge: 2017-09-18 | Disposition: A | Discharge status: Home / Self Care | Payer: Self-pay | Attending: Emergency Medicine | Admitting: Emergency Medicine

## 2017-09-18 DIAGNOSIS — K4090 Unilateral inguinal hernia, without obstruction or gangrene, not specified as recurrent: Secondary | ICD-10-CM | POA: Insufficient documentation

## 2017-09-18 DIAGNOSIS — Z87891 Personal history of nicotine dependence: Secondary | ICD-10-CM | POA: Insufficient documentation

## 2017-09-18 NOTE — ED Triage Notes (Signed)
Pt c/o right hernia pain since last Friday. Pain is worse with ambulation.

## 2017-09-18 NOTE — ED Provider Notes (Signed)
[facility]   CSN: [id number] Arrival date & time: 09/18/17  1139     History   Chief Complaint Chief Complaint  Patient presents with  . Hernia    HPI Zachary Stewart is a 27 y.o. male.  HPI Patient presents with right-sided inguinal hernia.  Has had for the last few years.  States he can push it in but it became more painful starting Friday.  States pain went to his testicles.  It is dull.  No nausea or vomiting.  No fevers.  No pain with urination.  No trauma.  States he has had a hernia for a while and is still able to push it in. History reviewed. No pertinent past medical history.  There are no active problems to display for this patient.   History reviewed. No pertinent surgical history.      Home Medications    Prior to Admission medications   Medication Sig Start Date End Date Taking? Authorizing Provider  PE-DM-GG-APAP&PE-Doxyl-DM-APAP (VICKS DAYQUIL/NYQUIL SEVERE) LQPK Take 1-2 capsules by mouth every 6 (six) hours as needed. 10/19/13   Arthor Captain, PA-C    Family History No family history on file.  Social History Social History   Tobacco Use  . Smoking status: Former Smoker  Substance Use Topics  . Alcohol use: Yes    Comment: rarely  . Drug use: No    Types: Marijuana     Allergies   Patient has no known allergies.   Review of Systems Review of Systems  Constitutional: Negative for appetite change, chills and fever.  Cardiovascular: Negative for chest pain.  Gastrointestinal: Positive for abdominal pain. Negative for nausea and vomiting.  Genitourinary: Positive for testicular pain. Negative for difficulty urinating and flank pain.  Musculoskeletal: Negative for back pain.  Neurological: Negative for weakness.  Psychiatric/Behavioral: Negative for confusion.     Physical Exam Updated Vital Signs BP 134/87 (BP Location: Left Arm)   Pulse 68   Temp 98.7 F (37.1 C) (Oral)   Resp 16    Ht 6' (1.829 m)   Wt 80.7 kg   SpO2 98%   BMI 24.14 kg/m   Physical Exam  Constitutional: He appears well-developed.  HENT:  Head: Normocephalic.  Eyes: Pupils are equal, round, and reactive to light.  Neck: Normal range of motion.  Cardiovascular: Normal rate.  Pulmonary/Chest: Effort normal.  Abdominal: There is no guarding. A hernia is present.  Reducible right-sided inguinal hernia.  No testicular tenderness.  No skin changes.  Musculoskeletal: He exhibits no tenderness.  Neurological: He is alert.  Skin: Skin is warm. Capillary refill takes less than 2 seconds.     ED Treatments / Results  Labs (all labs ordered are listed, but only abnormal results are displayed) Labs Reviewed - No data to display  EKG None  Radiology No results found.  Procedures Procedures (including critical care time)  Medications Ordered in ED Medications - No data to display   Initial Impression / Assessment and Plan / ED Course  I have reviewed the triage vital signs and the nursing notes.  Pertinent labs & imaging results that were available during my care of the patient were reviewed by me and considered in my medical decision making (see chart for details).     Patient with reducible right-sided inguinal hernia.  Not incarcerated.  Will need follow-up with general surgery.  Instructed on hernia truss and self reduction.  Discharge home.  Patient's fitness for duty form  filled out.  Final Clinical Impressions(s) / ED Diagnoses   Final diagnoses:  Right inguinal hernia    ED Discharge Orders    None       Benjiman Core, MD 09/18/17 1245

## 2017-09-20 ENCOUNTER — Emergency Department (HOSPITAL_COMMUNITY)
Admission: EM | Admit: 2017-09-20 | Discharge: 2017-09-20 | Disposition: A | Discharge status: Home / Self Care | Payer: Self-pay | Attending: Emergency Medicine | Admitting: Emergency Medicine

## 2017-09-20 ENCOUNTER — Emergency Department (HOSPITAL_COMMUNITY): Payer: Self-pay

## 2017-09-20 ENCOUNTER — Other Ambulatory Visit: Payer: Self-pay

## 2017-09-20 DIAGNOSIS — N432 Other hydrocele: Secondary | ICD-10-CM | POA: Insufficient documentation

## 2017-09-20 DIAGNOSIS — Z87891 Personal history of nicotine dependence: Secondary | ICD-10-CM | POA: Insufficient documentation

## 2017-09-20 DIAGNOSIS — K4090 Unilateral inguinal hernia, without obstruction or gangrene, not specified as recurrent: Secondary | ICD-10-CM | POA: Insufficient documentation

## 2017-09-20 LAB — URINALYSIS, ROUTINE W REFLEX MICROSCOPIC
Bilirubin Urine: NEGATIVE
Glucose, UA: NEGATIVE mg/dL
Hgb urine dipstick: NEGATIVE
KETONES UR: 5 mg/dL — AB
Leukocytes, UA: NEGATIVE
Nitrite: NEGATIVE
PH: 6 (ref 5.0–8.0)
Protein, ur: NEGATIVE mg/dL
Specific Gravity, Urine: 1.03 (ref 1.005–1.030)

## 2017-09-20 MED ORDER — HYDROCODONE-ACETAMINOPHEN 5-325 MG PO TABS: 1.0000 | ORAL_TABLET | ORAL | 0 refills | Status: DC | PRN

## 2017-09-20 MED ORDER — BACITRACIN ZINC 500 UNIT/GM EX OINT
TOPICAL_OINTMENT | CUTANEOUS | Status: AC
  Filled 2017-09-20: qty 0.9

## 2017-09-20 MED ORDER — HYDROCODONE-ACETAMINOPHEN 5-325 MG PO TABS
1.0000 | ORAL_TABLET | Freq: Once | ORAL | Status: AC
  Administered 2017-09-20: 1 via ORAL
  Filled 2017-09-20: qty 1

## 2017-09-20 NOTE — ED Triage Notes (Signed)
Patient here with c/o groin pain. Patient hx of hernia. Patient states pain getting worse today after working all day. Patient has appointment with surgeon this Tuesday.

## 2017-09-20 NOTE — Discharge Instructions (Signed)
Your ultrasound show that you have a mild hydrocele. Follow up with the surgeon as scheduled. If you continue to have pain in the testicle you can see a urologist.

## 2017-09-20 NOTE — ED Provider Notes (Signed)
[facility] Provider Note   CSN: [id number] Arrival date & time: 09/20/17  1837     History   Chief Complaint Chief Complaint  Patient presents with  . Groin Pain    HPI Zachary Stewart is a 27 y.o. male who presents to the ED with c/o groin pain. Patient with hx of hernia that was dx here 2 days ago. Patient states that the pain increased after working all day today. Patient has appointment with surgeon in 5 days. Patient concerned tonight due to the pain that is in his right scrotal area.  HPI  No past medical history on file.  There are no active problems to display for this patient.   No past surgical history on file.      Home Medications    Prior to Admission medications   Medication Sig Start Date End Date Taking? Authorizing Provider  HYDROcodone-acetaminophen (NORCO/VICODIN) 5-325 MG tablet Take 1 tablet by mouth every 4 (four) hours as needed. 09/20/17   Janne Napoleon, NP  PE-DM-GG-APAP&PE-Doxyl-DM-APAP (VICKS DAYQUIL/NYQUIL SEVERE) LQPK Take 1-2 capsules by mouth every 6 (six) hours as needed. 10/19/13   Arthor Captain, PA-C    Family History No family history on file.  Social History Social History   Tobacco Use  . Smoking status: Former Smoker  Substance Use Topics  . Alcohol use: Yes    Comment: rarely  . Drug use: No    Types: Marijuana     Allergies   Patient has no known allergies.   Review of Systems Review of Systems  Gastrointestinal: Positive for abdominal pain (right lower).  Genitourinary: Positive for scrotal swelling and testicular pain. Negative for dysuria.  All other systems reviewed and are negative.    Physical Exam Updated Vital Signs BP (!) 141/86 (BP Location: Left Arm)   Pulse 79   Temp 98.9 F (37.2 C) (Oral)   Resp 18   Ht 5\' 11"  (1.803 m)   Wt 80.7 kg   SpO2 99%   BMI 24.81 kg/m   Physical Exam  Constitutional: He appears well-developed and well-nourished. No distress.    HENT:  Head: Normocephalic.  Eyes: EOM are normal.  Neck: Neck supple.  Cardiovascular: Normal rate.  Pulmonary/Chest: Effort normal.  Abdominal: Soft. A hernia is present. Hernia confirmed positive in the right inguinal area.    Genitourinary: Right testis shows tenderness. Swelling: minimal. Circumcised. No penile tenderness. No discharge found.  Musculoskeletal: Normal range of motion.  Neurological: He is alert.  Skin: Skin is warm and dry.  Psychiatric: He has a normal mood and affect.  Nursing note and vitals reviewed.    ED Treatments / Results  Labs (all labs ordered are listed, but only abnormal results are displayed) Labs Reviewed  URINALYSIS, ROUTINE W REFLEX MICROSCOPIC - Abnormal; Notable for the following components:      Result Value   Ketones, ur 5 (*)    All other components within normal limits   Radiology No results found.  Procedures Procedures (including critical care time)  Medications Ordered in ED Medications  bacitracin 500 UNIT/GM ointment (has no administration in time range)  HYDROcodone-acetaminophen (NORCO/VICODIN) 5-325 MG per tablet 1 tablet (1 tablet Oral Given 09/20/17 2057)     Initial Impression / Assessment and Plan / ED Course  I have reviewed the triage vital signs and the nursing notes.   27 y.o. male here with right inguinal hernia and right testicular pain stable for d/c to f/u with  the surgeon as scheduled. Pain managed in the ED. Patient ultrasound shows no torsion it does show small hydrocele. Instructions to the regarding f/u with urology if symptoms persist. Patient will keep appointment with surgeon as scheduled.   Final Clinical Impressions(s) / ED Diagnoses   Final diagnoses:  Other hydrocele  Hernia, inguinal, right    ED Discharge Orders         Ordered    HYDROcodone-acetaminophen (NORCO/VICODIN) 5-325 MG tablet  Every 4 hours PRN     09/20/17 2238           Kerrie Buffalo Eitzen, NP 09/20/17 2242    Melene Plan, DO 09/20/17 2258

## 2017-09-30 ENCOUNTER — Emergency Department (HOSPITAL_COMMUNITY)
Admission: EM | Admit: 2017-09-30 | Discharge: 2017-09-30 | Disposition: A | Discharge status: Home / Self Care | Payer: Managed Care, Other (non HMO) | Attending: Emergency Medicine | Admitting: Emergency Medicine

## 2017-09-30 ENCOUNTER — Encounter (HOSPITAL_COMMUNITY): Payer: Self-pay | Admitting: Emergency Medicine

## 2017-09-30 DIAGNOSIS — Z0279 Encounter for issue of other medical certificate: Secondary | ICD-10-CM | POA: Diagnosis present

## 2017-09-30 DIAGNOSIS — K4090 Unilateral inguinal hernia, without obstruction or gangrene, not specified as recurrent: Secondary | ICD-10-CM | POA: Diagnosis not present

## 2017-09-30 DIAGNOSIS — Z87891 Personal history of nicotine dependence: Secondary | ICD-10-CM | POA: Diagnosis not present

## 2017-09-30 HISTORY — DX: Unilateral inguinal hernia, without obstruction or gangrene, not specified as recurrent: K40.90

## 2017-09-30 MED ORDER — HYDROCODONE-ACETAMINOPHEN 5-325 MG PO TABS: 1.0000 | ORAL_TABLET | Freq: Four times a day (QID) | ORAL | 0 refills | Status: DC | PRN

## 2017-09-30 NOTE — Discharge Instructions (Addendum)
Alternate 600 mg of ibuprofen and [phone] mg of Tylenol every 3-4 hours as needed for pain. Do not exceed 4000 mg of Tylenol daily.  Take ibuprofen with food to avoid upset stomach issues.  You can take hydrocodone for severe pain but do not drive, drink alcohol, operate heavy machinery or make important decisions while taking this medication.  It may make you drowsy.  You can also cut these tablets in half.  Be aware that they also contains Tylenol.   Avoid heavy lifting.  Follow-up with your surgeon to schedule surgery as soon as possible.  Return to the emergency department if any concerning signs or symptoms develop such as fevers, worsening abdominal pain, persistent vomiting, constipation, diarrhea, or if the hernia cannot be pushed back in place.

## 2017-09-30 NOTE — ED Notes (Signed)
Bed: WTR8 Expected date:  Expected time:  Means of arrival:  Comments: 

## 2017-09-30 NOTE — ED Triage Notes (Signed)
Pt to have surgery for hernia, and has seen surgeon. Pt states he has been attempting to return to work and has tried light duty but unable to work and needs work note. Pt c/o pain in R groin that worsens when he works.

## 2017-09-30 NOTE — ED Notes (Signed)
Bed: WTR7 Expected date:  Expected time:  Means of arrival:  Comments: 

## 2017-09-30 NOTE — ED Provider Notes (Signed)
[facility] DEPT Provider Note   CSN: [id number] Arrival date & time: 09/30/17  1024     History   Chief Complaint Chief Complaint  Patient presents with  . needs work note  . Hernia    HPI Zachary Stewart is a 27 y.o. male with history of known right inguinal hernia presents requesting work note.  He was seen and evaluated on 09/18/2017 and 09/20/2017 for his inguinal hernia.  He has met with his general surgeon Dr. Rosendo Gros and will be handing in his deposit to schedule his surgery tomorrow.  He returns because his pain significantly worsens while at work after working 5 to 6 hours.  He states that he is able to tolerate light duty and working shorter shifts but when he works longer shifts he experiences severe pain.  Pain is intermittent, at times throbbing and at times sharp.  It is located along the right groin and at times radiates to the right testicle.  He denies fevers, chills, nausea, vomiting, constipation, diarrhea, melena, hematochezia, or urinary symptoms.  He states he has been taking ibuprofen and Tylenol regularly but when his pain is very severe it does not typically control it.  He was given a small supply of hydrocodone the last time he was in the ED which he states has been helpful in his pain becomes very severe and that he has been careful about taking it appropriately.  The history is provided by the patient.    Past Medical History:  Diagnosis Date  . Hernia, inguinal, right     There are no active problems to display for this patient.   History reviewed. No pertinent surgical history.      Home Medications    Prior to Admission medications   Medication Sig Start Date End Date Taking? Authorizing Provider  HYDROcodone-acetaminophen (NORCO/VICODIN) 5-325 MG tablet Take 1 tablet by mouth every 6 (six) hours as needed for severe pain. 09/30/17   Trichelle Lehan A, PA-C  PE-DM-GG-APAP&PE-Doxyl-DM-APAP (VICKS DAYQUIL/NYQUIL SEVERE) LQPK  Take 1-2 capsules by mouth every 6 (six) hours as needed. 10/19/13   Margarita Mail, PA-C    Family History History reviewed. No pertinent family history.  Social History Social History   Tobacco Use  . Smoking status: Former Smoker  Substance Use Topics  . Alcohol use: Yes    Comment: rarely  . Drug use: No    Types: Marijuana     Allergies   Patient has no known allergies.   Review of Systems Review of Systems  Constitutional: Negative for chills and fever.  Gastrointestinal: Positive for abdominal pain. Negative for blood in stool, constipation, diarrhea, nausea and vomiting.  Genitourinary: Negative for discharge, flank pain, hematuria, penile pain, penile swelling and scrotal swelling.     Physical Exam Updated Vital Signs BP (!) 147/105 (BP Location: Left Arm)   Pulse 82   Temp 98.5 F (36.9 C) (Oral)   Resp 18   SpO2 100%   Physical Exam  Constitutional: He appears well-developed and well-nourished. No distress.  HENT:  Head: Normocephalic and atraumatic.  Eyes: Conjunctivae are normal. Right eye exhibits no discharge. Left eye exhibits no discharge.  Neck: No JVD present. No tracheal deviation present.  Cardiovascular: Normal rate, regular rhythm and normal heart sounds.  Pulmonary/Chest: Effort normal and breath sounds normal.  Abdominal: Soft. Bowel sounds are normal. He exhibits no distension and no mass. There is no tenderness. There is no guarding. A hernia is present.  Easily reducible  right inguinal hernia, mildly tender to palpation  Genitourinary:  Genitourinary Comments: Deferred, patient denies any testicular pain, swelling, or tenderness at this time  Musculoskeletal: He exhibits no edema.  Neurological: He is alert.  Skin: Skin is warm and dry. No erythema.  Psychiatric: He has a normal mood and affect. His behavior is normal.  Nursing note and vitals reviewed.    ED Treatments / Results  Labs (all labs ordered are listed, but only  abnormal results are displayed) Labs Reviewed - No data to display  EKG None  Radiology No results found.  Procedures Procedures (including critical care time)  Medications Ordered in ED Medications - No data to display   Initial Impression / Assessment and Plan / ED Course  I have reviewed the triage vital signs and the nursing notes.  Pertinent labs & imaging results that were available during my care of the patient were reviewed by me and considered in my medical decision making (see chart for details).     Patient with known right inguinal hernia.  Presents for work note.  That he is following up with his surgeon and is in the process of scheduling surgery.  He is afebrile, vital signs are stable.  He is nontoxic in appearance.  Hernia is easily reducible, no evidence of incarceration or strangulation.  He had a scrotal ultrasound the last time he was here for this earlier this month which showed bilateral hydroceles but otherwise no evidence of testicular torsion or other abnormality.  Doubt obstruction, perforation, appendicitis, colitis, or other acute surgical abdominal pathology.  We will give him a small amount of Norco for severe pain, [place] controlled substance registry was queried and there are no inconsistencies.  I did inform him that we cannot continue to prescribe narcotics for this long-term and that his surgeon should take over for pain management.  He verbalized understanding of this.  We will give work note.  Discussed strict ED return precautions.  Patient and patient's significant other verbalized understanding of and agreement with plan and patient is stable for discharge home at this time.  Final Clinical Impressions(s) / ED Diagnoses   Final diagnoses:  Right inguinal hernia    ED Discharge Orders         Ordered    HYDROcodone-acetaminophen (NORCO/VICODIN) 5-325 MG tablet  Every 6 hours PRN     09/30/17 1235           Meg Niemeier, Belton A,  PA-C 09/30/17 1239    Malvin Johns, MD 09/30/17 1504

## 2017-10-01 ENCOUNTER — Encounter (HOSPITAL_COMMUNITY): Payer: Self-pay | Admitting: *Deleted

## 2017-10-01 NOTE — Progress Notes (Signed)
Spoke to triage nurse office at [facility] and requested that orders be placed in computer.

## 2017-10-01 NOTE — Progress Notes (Signed)
Denies chest pain, shortness of breath, or cardiology visit.  

## 2017-10-02 ENCOUNTER — Ambulatory Visit (HOSPITAL_COMMUNITY)
Admission: RE | Admit: 2017-10-02 | Discharge: 2017-10-02 | Disposition: A | Discharge status: Home / Self Care | Payer: Managed Care, Other (non HMO) | Source: Ambulatory Visit | Attending: General Surgery | Admitting: General Surgery

## 2017-10-02 ENCOUNTER — Ambulatory Visit (HOSPITAL_COMMUNITY): Payer: Managed Care, Other (non HMO) | Admitting: Anesthesiology

## 2017-10-02 ENCOUNTER — Encounter (HOSPITAL_COMMUNITY): Payer: Self-pay | Admitting: Urology

## 2017-10-02 ENCOUNTER — Ambulatory Visit: Payer: Self-pay | Admitting: General Surgery

## 2017-10-02 ENCOUNTER — Encounter (HOSPITAL_COMMUNITY)
Admission: RE | Disposition: A | Discharge status: Home / Self Care | Payer: Self-pay | Source: Ambulatory Visit | Attending: General Surgery

## 2017-10-02 DIAGNOSIS — Z87891 Personal history of nicotine dependence: Secondary | ICD-10-CM | POA: Insufficient documentation

## 2017-10-02 DIAGNOSIS — K4090 Unilateral inguinal hernia, without obstruction or gangrene, not specified as recurrent: Secondary | ICD-10-CM | POA: Insufficient documentation

## 2017-10-02 DIAGNOSIS — Z8719 Personal history of other diseases of the digestive system: Secondary | ICD-10-CM

## 2017-10-02 DIAGNOSIS — Z9889 Other specified postprocedural states: Secondary | ICD-10-CM

## 2017-10-02 HISTORY — PX: INGUINAL HERNIA REPAIR: SHX194

## 2017-10-02 HISTORY — PX: INSERTION OF MESH: SHX5868

## 2017-10-02 LAB — CBC
HCT: 48.5 % (ref 39.0–52.0)
Hemoglobin: 16.3 g/dL (ref 13.0–17.0)
MCH: 29.4 pg (ref 26.0–34.0)
MCHC: 33.6 g/dL (ref 30.0–36.0)
MCV: 87.4 fL (ref 78.0–100.0)
PLATELETS: 171 10*3/uL (ref 150–400)
RBC: 5.55 MIL/uL (ref 4.22–5.81)
RDW: 12.4 % (ref 11.5–15.5)
WBC: 4.9 10*3/uL (ref 4.0–10.5)

## 2017-10-02 SURGERY — REPAIR, HERNIA, INGUINAL, LAPAROSCOPIC
Anesthesia: General

## 2017-10-02 MED ORDER — ONDANSETRON HCL 4 MG/2ML IJ SOLN
INTRAMUSCULAR | Status: DC | PRN
  Administered 2017-10-02: 4 mg via INTRAVENOUS

## 2017-10-02 MED ORDER — FENTANYL CITRATE (PF) 250 MCG/5ML IJ SOLN
INTRAMUSCULAR | Status: DC | PRN
  Administered 2017-10-02: 100 ug via INTRAVENOUS
  Administered 2017-10-02: 50 ug via INTRAVENOUS

## 2017-10-02 MED ORDER — DEXAMETHASONE SODIUM PHOSPHATE 10 MG/ML IJ SOLN
INTRAMUSCULAR | Status: DC | PRN
  Administered 2017-10-02: 10 mg via INTRAVENOUS

## 2017-10-02 MED ORDER — CEFAZOLIN SODIUM-DEXTROSE 2-4 GM/100ML-% IV SOLN
2.0000 g | INTRAVENOUS | Status: AC
  Administered 2017-10-02: 2 g via INTRAVENOUS

## 2017-10-02 MED ORDER — PROPOFOL 10 MG/ML IV BOLUS
INTRAVENOUS | Status: DC | PRN
  Administered 2017-10-02: 200 mg via INTRAVENOUS

## 2017-10-02 MED ORDER — TRAMADOL HCL 50 MG PO TABS
ORAL_TABLET | ORAL | Status: AC
  Administered 2017-10-02: 50 mg via ORAL
  Filled 2017-10-02: qty 1

## 2017-10-02 MED ORDER — LACTATED RINGERS IV SOLN
INTRAVENOUS | Status: DC
  Administered 2017-10-02: 11:00:00 via INTRAVENOUS

## 2017-10-02 MED ORDER — CEFAZOLIN SODIUM-DEXTROSE 2-4 GM/100ML-% IV SOLN
INTRAVENOUS | Status: AC
  Filled 2017-10-02: qty 100

## 2017-10-02 MED ORDER — FENTANYL CITRATE (PF) 100 MCG/2ML IJ SOLN
25.0000 ug | INTRAMUSCULAR | Status: DC | PRN
  Administered 2017-10-02 (×3): 50 ug via INTRAVENOUS

## 2017-10-02 MED ORDER — MIDAZOLAM HCL 2 MG/2ML IJ SOLN
INTRAMUSCULAR | Status: AC
  Filled 2017-10-02: qty 2

## 2017-10-02 MED ORDER — GABAPENTIN 300 MG PO CAPS
300.0000 mg | ORAL_CAPSULE | ORAL | Status: AC
  Administered 2017-10-02: 300 mg via ORAL

## 2017-10-02 MED ORDER — 0.9 % SODIUM CHLORIDE (POUR BTL) OPTIME
TOPICAL | Status: DC | PRN
  Administered 2017-10-02: 1000 mL

## 2017-10-02 MED ORDER — ONDANSETRON HCL 4 MG/2ML IJ SOLN
INTRAMUSCULAR | Status: AC
  Filled 2017-10-02: qty 2

## 2017-10-02 MED ORDER — FENTANYL CITRATE (PF) 100 MCG/2ML IJ SOLN
INTRAMUSCULAR | Status: AC
  Filled 2017-10-02: qty 2

## 2017-10-02 MED ORDER — TRAMADOL HCL 50 MG PO TABS: 50.0000 mg | ORAL_TABLET | Freq: Four times a day (QID) | ORAL | 0 refills | Status: DC | PRN

## 2017-10-02 MED ORDER — LIDOCAINE 2% (20 MG/ML) 5 ML SYRINGE
INTRAMUSCULAR | Status: AC
  Filled 2017-10-02: qty 5

## 2017-10-02 MED ORDER — LIDOCAINE 2% (20 MG/ML) 5 ML SYRINGE
INTRAMUSCULAR | Status: DC | PRN
  Administered 2017-10-02: 100 mg via INTRAVENOUS

## 2017-10-02 MED ORDER — TRAMADOL HCL 50 MG PO TABS
50.0000 mg | ORAL_TABLET | Freq: Once | ORAL | Status: AC
  Administered 2017-10-02: 50 mg via ORAL

## 2017-10-02 MED ORDER — MIDAZOLAM HCL 5 MG/5ML IJ SOLN
INTRAMUSCULAR | Status: DC | PRN
  Administered 2017-10-02: 2 mg via INTRAVENOUS

## 2017-10-02 MED ORDER — ROCURONIUM BROMIDE 10 MG/ML (PF) SYRINGE
PREFILLED_SYRINGE | INTRAVENOUS | Status: DC | PRN
  Administered 2017-10-02: 50 mg via INTRAVENOUS

## 2017-10-02 MED ORDER — ACETAMINOPHEN 500 MG PO TABS
ORAL_TABLET | ORAL | Status: AC
  Administered 2017-10-02: 1000 mg via ORAL
  Filled 2017-10-02: qty 2

## 2017-10-02 MED ORDER — ROCURONIUM BROMIDE 50 MG/5ML IV SOSY
PREFILLED_SYRINGE | INTRAVENOUS | Status: AC
  Filled 2017-10-02: qty 10

## 2017-10-02 MED ORDER — PROPOFOL 10 MG/ML IV BOLUS
INTRAVENOUS | Status: AC
  Filled 2017-10-02: qty 20

## 2017-10-02 MED ORDER — DEXAMETHASONE SODIUM PHOSPHATE 10 MG/ML IJ SOLN
INTRAMUSCULAR | Status: AC
  Filled 2017-10-02: qty 1

## 2017-10-02 MED ORDER — FENTANYL CITRATE (PF) 250 MCG/5ML IJ SOLN
INTRAMUSCULAR | Status: AC
  Filled 2017-10-02: qty 5

## 2017-10-02 MED ORDER — GABAPENTIN 300 MG PO CAPS
ORAL_CAPSULE | ORAL | Status: AC
  Administered 2017-10-02: 300 mg via ORAL
  Filled 2017-10-02: qty 1

## 2017-10-02 MED ORDER — BUPIVACAINE HCL 0.25 % IJ SOLN
INTRAMUSCULAR | Status: DC | PRN
  Administered 2017-10-02: 5 mL

## 2017-10-02 MED ORDER — CHLORHEXIDINE GLUCONATE CLOTH 2 % EX PADS: 6.0000 | MEDICATED_PAD | Freq: Once | CUTANEOUS | Status: DC

## 2017-10-02 MED ORDER — SCOPOLAMINE 1 MG/3DAYS TD PT72
1.0000 | MEDICATED_PATCH | TRANSDERMAL | Status: DC
  Administered 2017-10-02: 1.5 mg via TRANSDERMAL
  Filled 2017-10-02: qty 1

## 2017-10-02 MED ORDER — ACETAMINOPHEN 500 MG PO TABS
1000.0000 mg | ORAL_TABLET | ORAL | Status: AC
  Administered 2017-10-02: 1000 mg via ORAL

## 2017-10-02 SURGICAL SUPPLY — 36 items
CANISTER SUCT 3000ML PPV (MISCELLANEOUS) IMPLANT
COVER SURGICAL LIGHT HANDLE (MISCELLANEOUS) ×3 IMPLANT
DEFOGGER SCOPE WARMER CLEARIFY (MISCELLANEOUS) IMPLANT
DERMABOND ADVANCED (GAUZE/BANDAGES/DRESSINGS) ×2
DERMABOND ADVANCED .7 DNX12 (GAUZE/BANDAGES/DRESSINGS) ×1 IMPLANT
DISSECTOR BLUNT TIP ENDO 5MM (MISCELLANEOUS) IMPLANT
ELECT REM PT RETURN 9FT ADLT (ELECTROSURGICAL) ×3
ELECTRODE REM PT RTRN 9FT ADLT (ELECTROSURGICAL) ×1 IMPLANT
GLOVE BIO SURGEON STRL SZ7.5 (GLOVE) ×3 IMPLANT
GOWN STRL REUS W/ TWL LRG LVL3 (GOWN DISPOSABLE) ×2 IMPLANT
GOWN STRL REUS W/ TWL XL LVL3 (GOWN DISPOSABLE) ×1 IMPLANT
GOWN STRL REUS W/TWL LRG LVL3 (GOWN DISPOSABLE) ×4
GOWN STRL REUS W/TWL XL LVL3 (GOWN DISPOSABLE) ×2
KIT BASIN OR (CUSTOM PROCEDURE TRAY) ×3 IMPLANT
KIT TURNOVER KIT B (KITS) ×3 IMPLANT
MESH 3DMAX 5X7 RT XLRG (Mesh General) ×3 IMPLANT
NEEDLE INSUFFLATION 14GA 120MM (NEEDLE) IMPLANT
NS IRRIG 1000ML POUR BTL (IV SOLUTION) ×3 IMPLANT
PAD ARMBOARD 7.5X6 YLW CONV (MISCELLANEOUS) ×6 IMPLANT
RELOAD STAPLE HERNIA 4.0 BLUE (INSTRUMENTS) ×3 IMPLANT
RELOAD STAPLE HERNIA 4.8 BLK (STAPLE) IMPLANT
SCISSORS LAP 5X35 DISP (ENDOMECHANICALS) ×3 IMPLANT
SET IRRIG TUBING LAPAROSCOPIC (IRRIGATION / IRRIGATOR) IMPLANT
STAPLER HERNIA 12 8.5 360D (INSTRUMENTS) ×3 IMPLANT
SUT MNCRL AB 4-0 PS2 18 (SUTURE) ×3 IMPLANT
SUT VIC AB 1 CT1 27 (SUTURE)
SUT VIC AB 1 CT1 27XBRD ANBCTR (SUTURE) IMPLANT
SYRINGE TOOMEY DISP (SYRINGE) ×3 IMPLANT
TOWEL OR 17X24 6PK STRL BLUE (TOWEL DISPOSABLE) ×3 IMPLANT
TOWEL OR 17X26 10 PK STRL BLUE (TOWEL DISPOSABLE) ×3 IMPLANT
TRAY FOLEY W/BAG SLVR 14FR (SET/KITS/TRAYS/PACK) ×3 IMPLANT
TRAY LAPAROSCOPIC MC (CUSTOM PROCEDURE TRAY) ×3 IMPLANT
TROCAR CANNULA W/PORT DUAL 5MM (MISCELLANEOUS) ×3 IMPLANT
TROCAR XCEL 12X100 BLDLESS (ENDOMECHANICALS) ×3 IMPLANT
TUBING INSUFFLATION (TUBING) ×3 IMPLANT
WATER STERILE IRR 1000ML POUR (IV SOLUTION) ×3 IMPLANT

## 2017-10-02 NOTE — Anesthesia Postprocedure Evaluation (Signed)
Anesthesia Post Note  Patient: Rikki SpearingMalik Sox  Procedure(s) Performed: LAPAROSCOPIC RIGHT INGUINAL HERNIA REPAIR WITH MESH (N/A ) INSERTION OF MESH (N/A )     Patient location during evaluation: PACU Anesthesia Type: General Level of consciousness: awake and alert Pain management: pain level controlled Vital Signs Assessment: post-procedure vital signs reviewed and stable Respiratory status: spontaneous breathing, nonlabored ventilation, respiratory function stable and patient connected to nasal cannula oxygen Cardiovascular status: blood pressure returned to baseline and stable Postop Assessment: no apparent nausea or vomiting Anesthetic complications: no    Last Vitals:  Vitals:   10/02/17 1448 10/02/17 1451  BP: 120/85 125/82  Pulse: 70 74  Resp: 20 17  Temp: 36.6 C   SpO2: 99% 100%    Last Pain:  Vitals:   10/02/17 1448  TempSrc:   PainSc: 4                  Alyzah Pelly L Ahmon Tosi

## 2017-10-02 NOTE — Anesthesia Procedure Notes (Addendum)
Procedure Name: Intubation Date/Time: 10/02/2017 12:51 PM Performed by: Mariea Clonts, CRNA Pre-anesthesia Checklist: Patient identified, Emergency Drugs available, Suction available and Patient being monitored Patient Re-evaluated:Patient Re-evaluated prior to induction Oxygen Delivery Method: Circle System Utilized Preoxygenation: Pre-oxygenation with 100% oxygen Induction Type: IV induction and Cricoid Pressure applied Ventilation: Mask ventilation without difficulty Laryngoscope Size: Mac and 4 Grade View: Grade II Tube type: Oral Tube size: 7.5 mm Number of attempts: 1 Airway Equipment and Method: Stylet and Oral airway Placement Confirmation: ETT inserted through vocal cords under direct vision,  positive ETCO2 and breath sounds checked- equal and bilateral Tube secured with: Tape Dental Injury: Teeth and Oropharynx as per pre-operative assessment

## 2017-10-02 NOTE — Op Note (Signed)
10/02/2017  1:33 PM  PATIENT:  Zachary Stewart  27 y.o. male  PRE-OPERATIVE DIAGNOSIS:  RIGHT INGUINAL HERNIA  POST-OPERATIVE DIAGNOSIS:  RIGHT INDIRECT INGUINAL HERNIA  PROCEDURE:  Procedure(s): LAPAROSCOPIC RIGHT INGUINAL HERNIA REPAIR WITH MESH (N/A) INSERTION OF MESH (N/A)  SURGEON:  Surgeon(s) and Role:    * Axel Filleramirez, Tajai Ihde, MD - Primary  ANESTHESIA:   local and general  EBL:  minimal   BLOOD ADMINISTERED:none  DRAINS: none   LOCAL MEDICATIONS USED:  BUPIVICAINE   SPECIMEN:  No Specimen  DISPOSITION OF SPECIMEN:  N/A  COUNTS:  YES  TOURNIQUET:  * No tourniquets in log *  DICTATION: .Dragon Dictation   Counts: reported as correct x 2  Findings:  The patient had a small right INdirect hernia  Indications for procedure:  The patient is a 27 year old male with a RIGHT hernia for several months. Patient complained of symptomatology to his RIGHT inguinal area. The patient was taken back for elective inguinal hernia repair.  Details of the procedure: The patient was taken back to the operating room. The patient was placed in supine position with bilateral SCDs in place.  The patient was prepped and draped in the usual sterile fashion.  After appropriate anitbiotics were confirmed, a time-out was confirmed and all facts were verified.  0.25% Marcaine was used to infiltrate the umbilical area. A 11-blade was used to cut down the skin and blunt dissection was used to get the anterior fashion.  The anterior fascia was incised approximately 1 cm and the muscles were retracted laterally. Blunt dissection was then used to create a space in the preperitoneal area. At this time a 10 mm camera was then introduced into the space and advanced the pubic tubercle and a 12 mm trocar was placed over this and insufflation was started.  At this time and space was created from medial to laterally the preperitoneal space.  Cooper's ligament was initially cleaned off.  The hernia sac was  identified in the indirect space. Dissection of the hernia sac was undertaken the vas deferens was identified and protected in all parts of the case.    Once the hernia sac was taken down to approximately the umbilicus a Bard 3D Max mesh, size: xLarge, was  introduced into the preperitoneal space.  The mesh was brought over to cover the direct and indirect hernia spaces.  This was anchored into place and secured to Cooper's ligament with 4.980mm staples from a Coviden hernia stapler. It was anchored to the anterior abdominal wall with 4.8 mm staples. The hernia sac was seen lying posterior to the mesh. There was no staples placed laterally. The insufflation was evacuated and the peritoneum was seen posterior to the mesh. The trochars were removed. The anterior fascia was reapproximated using #1 Vicryl on a UR- 6.  Intra-abdominal air was evacuated and the Veress needle removed. The skin was reapproximated using 4-0 Monocryl subcuticular fashion the patient was awakened from general anesthesia and taken to recovery in stable condition.   PLAN OF CARE: Discharge to home after PACU  PATIENT DISPOSITION:  PACU - hemodynamically stable.   Delay start of Pharmacological VTE agent (>24hrs) due to surgical blood loss or risk of bleeding: not applicable

## 2017-10-02 NOTE — Anesthesia Preprocedure Evaluation (Signed)
Anesthesia Evaluation  Patient identified by MRN, date of birth, ID band Patient awake    Reviewed: Allergy & Precautions, NPO status , Patient's Chart, lab work & pertinent test results  Airway Mallampati: I       Dental  (+) Chipped,    Pulmonary neg pulmonary ROS, former smoker,    breath sounds clear to auscultation       Cardiovascular negative cardio ROS   Rhythm:Regular Rate:Normal     Neuro/Psych negative neurological ROS  negative psych ROS   GI/Hepatic negative GI ROS, Neg liver ROS,   Endo/Other  negative endocrine ROS  Renal/GU negative Renal ROS  negative genitourinary   Musculoskeletal negative musculoskeletal ROS (+)   Abdominal   Peds  Hematology negative hematology ROS (+)   Anesthesia Other Findings   Reproductive/Obstetrics                             Anesthesia Physical Anesthesia Plan  ASA: I  Anesthesia Plan: General   Post-op Pain Management:    Induction: Intravenous  PONV Risk Score and Plan: 2 and Dexamethasone, Ondansetron, Midazolam and Scopolamine patch - Pre-op  Airway Management Planned: Oral ETT  Additional Equipment:   Intra-op Plan:   Post-operative Plan: Extubation in OR  Informed Consent: I have reviewed the patients History and Physical, chart, labs and discussed the procedure including the risks, benefits and alternatives for the proposed anesthesia with the patient or authorized representative who has indicated his/her understanding and acceptance.   Dental advisory given  Plan Discussed with: CRNA  Anesthesia Plan Comments:         Anesthesia Quick Evaluation

## 2017-10-02 NOTE — H&P (Deleted)
History of Present Illness Zachary Stewart(Zachary Stewart; 09/21/2017 1:54 PM) The patient is a 27 year old male who presents for evaluation of gall stones. Referred by: Dr. Wilkie AyeHorton Chief Complaint: Gallstones  Patient is a 27 year old male who comes in with a 1-2 month history of abdominal pain, right upper quadrant pain. Patient states that the pain began after eating a high-fat diet. She states that the pain was associated with nausea and vomiting. On the second occurrence patient presented to the ER. Patient underwent ultrasound revealed multiple gallstones.  No signs of acute cholecystitis. I did review the scan personally. Patient's laboratory values were within normal limits. T bili was normal.  Patient's had continued right upper quadrant abdominal pain over the last several weeks. She states becoming more regular.    Past Surgical History Zachary Stewart(Zachary Stewart; 09/21/2017 1:47 PM) Cesarean Section - 1   Diagnostic Studies History Zachary Stewart(Zachary Stewart; 09/21/2017 1:47 PM) Colonoscopy  never Mammogram  never Pap Smear  1-5 years ago  Allergies Zachary Stewart(Zachary Stewart; 09/21/2017 1:48 PM) No Known Drug Allergies [09/21/2017]: Allergies Reconciled   Medication History Zachary Stewart(Zachary Stewart; 09/21/2017 1:48 PM) Tylenol (500MG  Capsule, Oral) Active. Medications Reconciled  Social History Zachary Stewart(Zachary Stewart; 09/21/2017 1:47 PM) Alcohol use  Occasional alcohol use. Caffeine use  Coffee. No drug use  Tobacco use  Former smoker.  Family History Zachary Stewart(Zachary Stewart; 09/21/2017 1:47 PM) Alcohol Abuse  Father.  Pregnancy / Birth History Zachary Stewart(Zachary Stewart; 09/21/2017 1:47 PM) Age at menarche  13 years. Contraceptive History  Contraceptive implant, Intrauterine device. Gravida  1 Irregular periods  Length (months) of breastfeeding  3-6 Maternal age  26-25 Para  1  Other Problems Zachary Stewart(Zachary Stewart; 09/21/2017 1:47 PM) No pertinent past medical history     Review of Systems Zachary Stewart(Dannielle Baskins Stewart; 09/21/2017 1:53  PM) General Not Present- Appetite Loss, Chills, Fatigue, Fever, Night Sweats, Weight Gain and Weight Loss. Skin Not Present- Change in Wart/Mole, Dryness, Hives, Jaundice, New Lesions, Non-Healing Wounds, Rash and Ulcer. HEENT Not Present- Earache, Hearing Loss, Hoarseness, Nose Bleed, Oral Ulcers, Ringing in the Ears, Seasonal Allergies, Sinus Pain, Sore Throat, Visual Disturbances, Wears glasses/contact lenses and Yellow Eyes. Respiratory Not Present- Bloody sputum, Chronic Cough, Difficulty Breathing, Snoring and Wheezing. Breast Not Present- Breast Mass, Breast Pain, Nipple Discharge and Skin Changes. Cardiovascular Not Present- Chest Pain, Difficulty Breathing Lying Down, Leg Cramps, Palpitations, Rapid Heart Rate, Shortness of Breath and Swelling of Extremities. Gastrointestinal Present- Abdominal Pain, Bloating and Indigestion. Not Present- Bloody Stool, Change in Bowel Habits, Chronic diarrhea, Constipation, Difficulty Swallowing, Excessive gas, Gets full quickly at meals, Hemorrhoids, Nausea, Rectal Pain and Vomiting. Male Genitourinary Not Present- Frequency, Nocturia, Painful Urination, Pelvic Pain and Urgency. Musculoskeletal Not Present- Back Pain, Joint Pain, Joint Stiffness, Muscle Pain, Muscle Weakness and Swelling of Extremities. Neurological Not Present- Decreased Memory, Fainting, Headaches, Numbness, Seizures, Tingling, Tremor, Trouble walking and Weakness. Psychiatric Not Present- Anxiety, Bipolar, Change in Sleep Pattern, Depression, Fearful and Frequent crying. Endocrine Not Present- Cold Intolerance, Excessive Hunger, Hair Changes, Heat Intolerance, Hot flashes and New Diabetes. Hematology Not Present- Blood Thinners, Easy Bruising, Excessive bleeding, Gland problems, HIV and Persistent Infections. All other systems negative  Vitals Zachary Stewart(Zachary Stewart; 09/21/2017 1:49 PM) 09/21/2017 1:49 PM Weight: 228.38 lb Height: 64in Body Surface Area: 2.07 m Body Mass Index: 39.2  kg/m  Temp.: 98.80F(Oral)  Pulse: 93 (Regular)  BP: 122/80 (Sitting, Left Arm, Standard)       Physical Exam Zachary Stewart(Zachary Stewart; 09/21/2017 1:55 PM) The physical exam findings are as follows:  Note:Constitutional: No acute distress, conversant, appears stated age  Eyes: Anicteric sclerae, moist conjunctiva, no lid lag  Neck: No thyromegaly, trachea midline, no cervical lymphadenopathy  Lungs: Clear to auscultation biilaterally, normal respiratory effot  Cardiovascular: regular rate & rhythm, no murmurs, no peripheal edema, pedal pulses 2+  GI: Soft, no masses or hepatosplenomegaly, non-tender to palpation  MSK: Normal gait, no clubbing cyanosis, edema  Skin: No rashes, palpation reveals normal skin turgor  Psychiatric: Appropriate judgment and insight, oriented to person, place, and time    Assessment & Plan Zachary Filler Stewart; 09/21/2017 1:55 PM) SYMPTOMATIC CHOLELITHIASIS (K80.20) Impression: 27 y/o F with gallstones 1. We will proceed to the operating room for a laparoscopic cholecystectomy  2. Risks and benefits were discussed with the patient to generally include, but not limited to: infection, bleeding, possible need for post op ERCP, damage to the bile ducts, bile leak, and possible need for further surgery. Alternatives were offered and described. All questions were answered and the patient voiced understanding of the procedure and wishes to proceed at this point with a laparoscopic cholecystectomy

## 2017-10-02 NOTE — Discharge Instructions (Signed)
[facility] _______[facility] °INGUINAL HERNIA REPAIR: POST OP INSTRUCTIONS ° °Always review your discharge instruction sheet given to you by the facility where your surgery was performed. °IF YOU HAVE DISABILITY OR FAMILY LEAVE FORMS, YOU MUST BRING THEM TO THE OFFICE FOR PROCESSING.   °DO NOT GIVE THEM TO YOUR DOCTOR. ° °1. A  prescription for pain medication may be given to you upon discharge.  Take your pain medication as prescribed, if needed.  If narcotic pain medicine is not needed, then you may take acetaminophen (Tylenol) or ibuprofen (Advil) as needed. °2. Take your usually prescribed medications unless otherwise directed. °If you need a refill on your pain medication, please contact your pharmacy.  They will contact our office to request authorization. Prescriptions will not be filled after 5 pm or on week-ends. °3. You should follow a light diet the first 24 hours after arrival home, such as soup and crackers, etc.  Be sure to include lots of fluids daily.  Resume your normal diet the day after surgery. °4.Most patients will experience some swelling and bruising around the umbilicus or in the groin and scrotum.  Ice packs and reclining will help.  Swelling and bruising can take several days to resolve.  °6. It is common to experience some constipation if taking pain medication after surgery.  Increasing fluid intake and taking a stool softener (such as Colace) will usually help or prevent this problem from occurring.  A mild laxative (Milk of Magnesia or Miralax) should be taken according to package directions if there are no bowel movements after 48 hours. °7. Unless discharge instructions indicate otherwise, you may remove your bandages 24-48 hours after surgery, and you may shower at that time.  You may have steri-strips (small skin tapes) in place directly over the incision.  These strips should be left on the skin for 7-10 days.  If your surgeon used skin glue on the incision, you may  shower in 24 hours.  The glue will flake off over the next 2-3 weeks.  Any sutures or staples will be removed at the office during your follow-up visit. °8. ACTIVITIES:  You may resume regular (light) daily activities beginning the next day--such as daily self-care, walking, climbing stairs--gradually increasing activities as tolerated.  You may have sexual intercourse when it is comfortable.  Refrain from any heavy lifting or straining until approved by your doctor. ° °a.You may drive when you are no longer taking prescription pain medication, you can comfortably wear a seatbelt, and you can safely maneuver your car and apply brakes. °b.RETURN TO WORK:   °_____________________________________________ ° °9.You should see your doctor in the office for a follow-up appointment approximately 2-3 weeks after your surgery.  Make sure that you call for this appointment within a day or two after you arrive home to insure a convenient appointment time. °10.OTHER INSTRUCTIONS: _________________________ °   _____________________________________ ° °WHEN TO CALL YOUR DOCTOR: °1. Fever over 101.0 °2. Inability to urinate °3. Nausea and/or vomiting °4. Extreme swelling or bruising °5. Continued bleeding from incision. °6. Increased pain, redness, or drainage from the incision ° °The clinic staff is available to answer your questions during regular business hours.  Please don’t hesitate to call and ask to speak to one of the nurses for clinical concerns.  If you have a medical emergency, go to the nearest emergency room or call 911.  A surgeon from [facility] is always on call at the hospital ° ° °[address], [address], [place], [place]  [address] ? ° [address] [address], [place], [place]   [address] °[phone] ? [phone] ? FAX [phone] °Web site: [email or web address] ° °

## 2017-10-02 NOTE — Transfer of Care (Signed)
Immediate Anesthesia Transfer of Care Note  Patient: Zachary Stewart  Procedure(s) Performed: LAPAROSCOPIC RIGHT INGUINAL HERNIA REPAIR WITH MESH (N/A ) INSERTION OF MESH (N/A )  Patient Location: PACU  Anesthesia Type:General  Level of Consciousness: awake, alert  and oriented  Airway & Oxygen Therapy: Patient Spontanous Breathing and Patient connected to nasal cannula oxygen  Post-op Assessment: Report given to RN and Post -op Vital signs reviewed and stable  Post vital signs: Reviewed and stable  Last Vitals:  Vitals Value Taken Time  BP 132/96 10/02/2017  1:51 PM  Temp    Pulse 94 10/02/2017  1:54 PM  Resp 19 10/02/2017  1:54 PM  SpO2 100 % 10/02/2017  1:54 PM  Vitals shown include unvalidated device data.  Last Pain:  Vitals:   10/02/17 1105  TempSrc:   PainSc: 0-No pain      Patients Stated Pain Goal: 3 (10/02/17 1105)  Complications: No apparent anesthesia complications

## 2017-10-03 ENCOUNTER — Encounter (HOSPITAL_COMMUNITY): Payer: Self-pay | Admitting: General Surgery

## 2017-10-08 NOTE — H&P (Signed)
History of Present Illness Zachary Filler MD; 09/25/2017 4:12 PM) The patient is a 27 year old male who presents with an inguinal hernia. Referred by: Dr. Rubin Stewart Chief Complaint: Right inguinal hernia  Patient is a 27 year old male with a two-month history of right inguinal hernia. Patient was recently in the ER secondary to right inguinal pain that he states was sharp and radiated down his testicle. Patient states in an ultrasound which revealed hernia and no signs of torsion. Did review this personally.  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  Patient has had no previous abdominal surgery.    Past Surgical History Zachary Stewart; 09/25/2017 3:55 PM) Breast Augmentation  Bilateral. Breast Biopsy  Left. multiple Breast Mass; Local Excision  Left. Breast Reconstruction  Right. Carotid Artery Surgery  Bilateral. multiple Cataract Surgery  Bilateral. Colon Removal - Complete  Colon Removal - Partial  Esophagomyotomy  Foot Surgery  Bilateral. Gallbladder Surgery - Laparoscopic  Gastric Bypass  Hip Surgery  Bilateral. Knee Surgery  Bilateral. Laparoscopic Inguinal Hernia Surgery  Left. Lung Surgery  Bilateral. Mammoplasty; Reduction  Bilateral. Mastectomy  Bilateral. Nephrectomy  Bilateral. Open Inguinal Hernia Surgery  Bilateral. Prostate Surgery - Removal  Sentinel Lymph Node Biopsy  Shoulder Surgery  Bilateral. Spinal Surgery Midback  Splenectomy  Tonsillectomy  Vasectomy  Ventral / Umbilical Hernia Surgery  Bilateral. multiple  Diagnostic Studies History Zachary Stewart; 09/25/2017 3:55 PM) Colonoscopy  never  Allergies Zachary Stewart; 09/25/2017 3:55 PM) No Known Drug Allergies [09/25/2017]: Allergies Reconciled   Medication History Zachary Stewart; 09/25/2017 3:55 PM) HYDROcodone-Acetaminophen (5-325MG  Tablet, Oral) Active. Medications  Reconciled  Social History Zachary Stewart; 09/25/2017 3:55 PM) Alcohol use  Moderate alcohol use. Caffeine use  Carbonated beverages, Coffee, Tea. Illicit drug use  Prefer to discuss with provider. Tobacco use  Current some day smoker.  Family History Zachary Stewart; 09/25/2017 3:55 PM) Alcohol Abuse  Family Members In General. Anesthetic complications  Daughter, Family Members In General. Arthritis  Family Members In General. Bleeding disorder  Daughter, Family Members In Zachary Stewart, Son. Breast Cancer  Daughter, Family Members In General, Sister. Cervical Cancer  Family Members In General. Diabetes Mellitus  Family Members In General. Hypertension  Family Members In General, Father. Kidney Disease  Daughter, Family Members In General, Sister. Malignant Neoplasm Of Pancreas  Family Members In General. Migraine Headache  Father, Mother, Son. Respiratory Condition  Brother, Daughter, Family Members In [place], Sister, [place]. Thyroid problems  Family Members In General, Father, Mother, Son.  Other Problems Zachary Stewart; 09/25/2017 3:55 PM) Anxiety Disorder  Arthritis  Asthma  Bladder Problems  Chronic Renal Failure Syndrome  Congestive Heart Failure  Crohn's Disease  Diabetes Mellitus  Enlarged Prostate  Gastroesophageal Reflux Disease  Heart murmur  Hepatitis  High blood pressure  Hypercholesterolemia  Inguinal Hernia  Kidney Stone  Migraine Headache  Myocardial infarction  Pancreatitis  Seizure Disorder  Sleep Apnea  Ulcerative Colitis     Review of Systems Zachary Filler MD; 09/25/2017 4:11 PM) General Not Present- Appetite Loss, Chills, Fatigue, Fever, Night Sweats, Weight Gain and Weight Loss. Skin Not Present- Change in Wart/Mole, Dryness, Hives, Jaundice, New Lesions, Non-Healing Wounds, Rash and Ulcer. HEENT Not Present- Earache, Hearing Loss, Hoarseness, Nose Bleed, Oral Ulcers, Ringing in the Ears, Seasonal  Allergies, Sinus Pain, Sore Throat, Visual Disturbances, Wears glasses/contact lenses and Yellow Eyes. Respiratory Not Present- Bloody sputum, Chronic Cough, Difficulty Breathing, Snoring and Wheezing. Breast Not Present- Breast Mass,  Breast Pain, Nipple Discharge and Skin Changes. Cardiovascular Not Present- Chest Pain, Difficulty Breathing Lying Down, Leg Cramps, Palpitations, Rapid Heart Rate, Shortness of Breath and Swelling of Extremities. Gastrointestinal Present- Abdominal Pain. Not Present- Bloating, Bloody Stool, Change in Bowel Habits, Chronic diarrhea, Constipation, Difficulty Swallowing, Excessive gas, Gets full quickly at meals, Hemorrhoids, Indigestion, Nausea, Rectal Pain and Vomiting. Male Genitourinary Not Present- Blood in Urine, Change in Urinary Stream, Frequency, Impotence, Nocturia, Painful Urination, Urgency and Urine Leakage. Musculoskeletal Present- Joint Stiffness and Muscle Pain. Not Present- Back Pain, Joint Pain, Muscle Weakness and Swelling of Extremities. Neurological Present- Trouble walking. Not Present- Decreased Memory, Fainting, Headaches, Numbness, Seizures, Tingling, Tremor and Weakness. All other systems negative  Vitals Zachary Lank(Jacqueline Stewart; 09/25/2017 3:56 PM) 09/25/2017 3:55 PM Weight: 171.2 lb Height: 71in Body Surface Area: 1.97 m Body Mass Index: 23.88 kg/m  Pain Level: 0/10 Temp.: 98.67F(Temporal)  Pulse: 64 (Regular)  P.OX: 97% (Room air) BP: 128/82 (Sitting, Left Arm, Standard)       Physical Exam Zachary Stewart(Zachary Grime MD; 09/25/2017 4:12 PM) The physical exam findings are as follows: Note:Constitutional: No acute distress, conversant, appears stated age  Eyes: Anicteric sclerae, moist conjunctiva, no lid lag  Neck: No thyromegaly, trachea midline, no cervical lymphadenopathy  Lungs: Clear to auscultation biilaterally, normal respiratory effot  Cardiovascular: regular rate & rhythm, no murmurs, no peripheal edema, pedal pulses  2+  GI: Soft, no masses or hepatosplenomegaly, non-tender to palpation  MSK: Normal gait, no clubbing cyanosis, edema  Skin: No rashes, palpation reveals normal skin turgor  Psychiatric: Appropriate judgment and insight, oriented to person, place, and time  Abdomen Inspection Hernias - Inguinal hernia - Right - Reducible.    Assessment & Plan Zachary Stewart(Christifer Chapdelaine MD; 09/25/2017 4:13 PM) RIGHT INGUINAL HERNIA (K40.90) Impression: 27 year old male with a right inguinal hernia  1. The patient will like to proceed to the operating room for laparoscopic right inguinal hernia repair with mesh.  2. I discussed with the patient the signs and symptoms of incarceration and strangulation and the need to proceed to the ER should they occur.  3. I discussed with the patient the risks and benefits of the procedure to include but not limited to: Infection, bleeding, damage to surrounding structures, possible need for further surgery, possible nerve pain, and possible recurrence. The patient was understanding and wishes to proceed.

## 2017-10-14 ENCOUNTER — Other Ambulatory Visit: Payer: Self-pay

## 2017-10-14 ENCOUNTER — Encounter (HOSPITAL_COMMUNITY): Payer: Self-pay | Admitting: *Deleted

## 2017-10-14 ENCOUNTER — Emergency Department (HOSPITAL_COMMUNITY): Payer: Managed Care, Other (non HMO)

## 2017-10-14 ENCOUNTER — Emergency Department (HOSPITAL_COMMUNITY)
Admission: EM | Admit: 2017-10-14 | Discharge: 2017-10-14 | Discharge status: Absent without leave | Payer: Managed Care, Other (non HMO)

## 2017-10-14 NOTE — ED Notes (Signed)
Bed: WA09 Expected date:  Expected time:  Means of arrival:  Comments: gpd stitches

## 2018-02-05 ENCOUNTER — Emergency Department (HOSPITAL_COMMUNITY)
Admission: EM | Admit: 2018-02-05 | Discharge: 2018-02-05 | Disposition: A | Discharge status: Home / Self Care | Payer: Managed Care, Other (non HMO) | Attending: Emergency Medicine | Admitting: Emergency Medicine

## 2018-02-05 ENCOUNTER — Encounter (HOSPITAL_COMMUNITY): Payer: Self-pay

## 2018-02-05 ENCOUNTER — Other Ambulatory Visit: Payer: Self-pay

## 2018-02-05 ENCOUNTER — Emergency Department (HOSPITAL_COMMUNITY): Payer: Managed Care, Other (non HMO)

## 2018-02-05 DIAGNOSIS — Z87891 Personal history of nicotine dependence: Secondary | ICD-10-CM | POA: Diagnosis not present

## 2018-02-05 DIAGNOSIS — N50811 Right testicular pain: Secondary | ICD-10-CM | POA: Insufficient documentation

## 2018-02-05 DIAGNOSIS — Z79899 Other long term (current) drug therapy: Secondary | ICD-10-CM | POA: Insufficient documentation

## 2018-02-05 DIAGNOSIS — R1031 Right lower quadrant pain: Secondary | ICD-10-CM

## 2018-02-05 LAB — URINALYSIS, ROUTINE W REFLEX MICROSCOPIC
Bilirubin Urine: NEGATIVE
GLUCOSE, UA: NEGATIVE mg/dL
HGB URINE DIPSTICK: NEGATIVE
Ketones, ur: NEGATIVE mg/dL
Leukocytes, UA: NEGATIVE
Nitrite: NEGATIVE
PROTEIN: NEGATIVE mg/dL
Specific Gravity, Urine: 1.031 — ABNORMAL HIGH (ref 1.005–1.030)
pH: 6 (ref 5.0–8.0)

## 2018-02-05 NOTE — ED Notes (Signed)
Ultrasound at bedside

## 2018-02-05 NOTE — Discharge Instructions (Signed)
Take motrin for pain.   Take gabapentin for groin pain.   Call surgery office for appointment   Return to ER if you have worse abdominal pain, groin pain, testicular pain, vomiting, fever

## 2018-02-05 NOTE — ED Triage Notes (Signed)
Patient states that he had a hernia repair in September 2019 and he has been off of restrictions. Patient states he is still feeling sharp pain in his testicle and right groin.

## 2018-02-05 NOTE — ED Provider Notes (Signed)
[facility]-EMERGENCY DEPT Provider Note   CSN: [id number] Arrival date & time: 02/05/18  0854     History   Chief Complaint Chief Complaint  Patient presents with  . Testicle Pain  . Groin Pain    HPI Zachary Stewart is a 28 y.o. male history of right inguinal hernia repair about 3 months ago here presenting with persistent right testicular and groin pain.  Patient states that he had a mesh placed for the right inguinal hernia repair in September last year.  Patient states that since the surgery, he has intermittent right groin pain radiate down to his right testicle.  He states that he has been on gabapentin with minimal relief.  Patient states that the pain is worse with movement.  He denies constipation or vomiting or fevers.  The history is provided by the patient.    Past Medical History:  Diagnosis Date  . Hernia, inguinal, right     There are no active problems to display for this patient.   Past Surgical History:  Procedure Laterality Date  . INGUINAL HERNIA REPAIR N/A 10/02/2017   Procedure: LAPAROSCOPIC RIGHT INGUINAL HERNIA REPAIR WITH MESH;  Surgeon: Axel Filleramirez, Armando, MD;  [facility];  Service: General;  Laterality: N/A;  . INSERTION OF MESH N/A 10/02/2017   Procedure: INSERTION OF MESH;  Surgeon: Axel Filleramirez, Armando, MD;  [facility];  Service: General;  Laterality: N/A;  . NO PAST SURGERIES          Home Medications    Prior to Admission medications   Medication Sig Start Date End Date Taking? Authorizing Provider  HYDROcodone-acetaminophen (NORCO/VICODIN) 5-325 MG tablet Take 1 tablet by mouth every 6 (six) hours as needed for severe pain. 09/30/17   Fawze, Mina A, PA-C  traMADol (ULTRAM) 50 MG tablet Take 1 tablet (50 mg total) by mouth every 6 (six) hours as needed. 10/02/17 10/02/18  Axel Filleramirez, Armando, MD    Family History Family History  Problem Relation Age of Onset  . Anemia Mother   . Hypertension Father     Social  History Social History   Tobacco Use  . Smoking status: Former Games developermoker  . Smokeless tobacco: Never Used  Substance Use Topics  . Alcohol use: Yes    Comment: rarely  . Drug use: Yes    Types: Marijuana    Comment: occasionally     Allergies   Patient has no known allergies.   Review of Systems Review of Systems  Genitourinary: Positive for testicular pain.  All other systems reviewed and are negative.    Physical Exam Updated Vital Signs BP (!) 142/70 (BP Location: Left Arm)   Pulse 64   Temp 97.7 F (36.5 C) (Oral)   Resp 16   Ht 6' (1.829 m)   Wt 80.7 kg   SpO2 100%   BMI 24.14 kg/m   Physical Exam Vitals signs and nursing note reviewed.  HENT:     Head: Normocephalic.     Nose: Nose normal.     Mouth/Throat:     Mouth: Mucous membranes are moist.  Eyes:     Extraocular Movements: Extraocular movements intact.     Pupils: Pupils are equal, round, and reactive to light.  Neck:     Musculoskeletal: Normal range of motion.  Cardiovascular:     Rate and Rhythm: Normal rate.  Pulmonary:     Effort: Pulmonary effort is normal.  Abdominal:     General: Abdomen is flat.  Genitourinary:  Comments: No obvious recurrent inguinal hernia. Mild tenderness R groin area. R testicle with possible small hydrocele, good cremasteric reflex  Musculoskeletal: Normal range of motion.  Skin:    General: Skin is warm.     Capillary Refill: Capillary refill takes less than 2 seconds.  Neurological:     General: No focal deficit present.     Mental Status: He is alert.  Psychiatric:        Mood and Affect: Mood normal.      ED Treatments / Results  Labs (all labs ordered are listed, but only abnormal results are displayed) Labs Reviewed  URINALYSIS, ROUTINE W REFLEX MICROSCOPIC - Abnormal; Notable for the following components:      Result Value   Specific Gravity, Urine 1.031 (*)    All other components within normal limits    EKG None  Radiology US  Scrotum W/doppler  Result Date: 02/05/2018 CLINICAL DATA:  Right testicular pain for 2 months EXAM: SCROTAL ULTRASOUND DOPPLER ULTRASOUND OF THE TESTICLES TECHNIQUE: Complete ultrasound examination of the testicles, epididymis, and other scrotal structures was performed. Color and spectral Doppler ultrasound were also utilized to evaluate blood flow to the testicles. COMPARISON:  None. FINDINGS: Right testicle Measurements: 5.4 x 2.5 x 3.9 cm. No mass or microlithiasis visualized. Left testicle Measurements: 5.5 x 2.4 x 3.4 cm. No mass or microlithiasis visualized. Right epididymis:  Normal in size and appearance. Left epididymis:  Normal in size and appearance. Hydrocele:  None visualized. Varicocele:  None visualized. Pulsed Doppler interrogation of both testes demonstrates normal low resistance arterial and venous waveforms bilaterally. No hernia is identified in bilateral groin. IMPRESSION: Normal testicular ultrasound. Electronically Signed   By: Sherian Rein M.D.   On: 02/05/2018 10:25    Procedures Procedures (including critical care time)  Medications Ordered in ED Medications - No data to display   Initial Impression / Assessment and Plan / ED Course  I have reviewed the triage vital signs and the nursing notes.  Pertinent labs & imaging results that were available during my care of the patient were reviewed by me and considered in my medical decision making (see chart for details).    Zachary Stewart is a 28 y.o. male here with R testicular pain after hernia repair. Likely radiation of pain from hernia vs small hydrocele. I doubt torsion. Will get testicular US, UA.   10:47 AM UA and ultrasound unremarkable. I think likely referred pain from hernia repair. He has gabapentin at home. Told him to continue motrin, gabapentin and call surgery office for follow up.   Final Clinical Impressions(s) / ED Diagnoses   Final diagnoses:  None    ED Discharge Orders    None       Charlynne Pander, MD 02/05/18 1047

## 2018-04-01 ENCOUNTER — Other Ambulatory Visit: Payer: Self-pay

## 2018-04-01 ENCOUNTER — Encounter (HOSPITAL_COMMUNITY): Payer: Self-pay

## 2018-04-01 ENCOUNTER — Emergency Department (HOSPITAL_COMMUNITY)
Admission: EM | Admit: 2018-04-01 | Discharge: 2018-04-01 | Disposition: A | Discharge status: Home / Self Care | Payer: Managed Care, Other (non HMO) | Attending: Emergency Medicine | Admitting: Emergency Medicine

## 2018-04-01 DIAGNOSIS — R350 Frequency of micturition: Secondary | ICD-10-CM

## 2018-04-01 DIAGNOSIS — Z87891 Personal history of nicotine dependence: Secondary | ICD-10-CM | POA: Insufficient documentation

## 2018-04-01 LAB — URINALYSIS, ROUTINE W REFLEX MICROSCOPIC
BILIRUBIN URINE: NEGATIVE
Glucose, UA: NEGATIVE mg/dL
Hgb urine dipstick: NEGATIVE
Ketones, ur: NEGATIVE mg/dL
Leukocytes,Ua: NEGATIVE
NITRITE: NEGATIVE
Protein, ur: NEGATIVE mg/dL
Specific Gravity, Urine: 1.023 (ref 1.005–1.030)
pH: 6 (ref 5.0–8.0)

## 2018-04-01 MED ORDER — LIDOCAINE HCL 1 % IJ SOLN
INTRAMUSCULAR | Status: AC
  Administered 2018-04-01: 12:00:00
  Filled 2018-04-01: qty 20

## 2018-04-01 MED ORDER — AZITHROMYCIN 250 MG PO TABS
1000.0000 mg | ORAL_TABLET | Freq: Once | ORAL | Status: AC
  Administered 2018-04-01: 1000 mg via ORAL
  Filled 2018-04-01: qty 4

## 2018-04-01 MED ORDER — CEFTRIAXONE SODIUM 250 MG IJ SOLR
250.0000 mg | Freq: Once | INTRAMUSCULAR | Status: AC
  Administered 2018-04-01: 250 mg via INTRAMUSCULAR
  Filled 2018-04-01: qty 250

## 2018-04-01 MED ORDER — NAPROXEN 500 MG PO TABS: 500.0000 mg | ORAL_TABLET | Freq: Two times a day (BID) | ORAL | 0 refills | Status: DC

## 2018-04-01 NOTE — ED Notes (Signed)
Pt asked if his urine will test for prostate cancer. Informed patient that urine is testing for urinary tract infection, prostate cancer testing will need to be discussed with the provider when they come in to talk to you.  Pt verbalized understanding.

## 2018-04-01 NOTE — Discharge Instructions (Addendum)
You were seen in the emergency department for urinary symptoms & back pain today.  Your urine test was normal and did not show evidence of infection.  We tested your urine for gonorrhea and chlamydia as well.  We have treated you for these in case they are positive, if positive you will need to inform all sexual partners.  Please abstain from sex for the next 1 week to avoid reinfection.  We also checked your prostate, it does not feel overly enlarged at this time, however it is important that you follow-up with your primary care provider within 3 to 5 days for a general reassessment of the symptoms.  Regarding your back pain At this time we suspect that your pain is related to a muscle strain/spasm.   I have prescribed you an anti-inflammatory medication to help with this.  - Naproxen is a nonsteroidal anti-inflammatory medication that will help with pain and swelling. Be sure to take this medication as prescribed with food, 1 pill every 12 hours,  It should be taken with food, as it can cause stomach upset, and more seriously, stomach bleeding. Do not take other nonsteroidal anti-inflammatory medications with this such as Advil, Motrin, Aleve, Mobic, Goodie Powder, or Motrin.    You make take Tylenol per over the counter dosing with these medications.   We have prescribed you new medication(s) today. Discuss the medications prescribed today with your pharmacist as they can have adverse effects and interactions with your other medicines including over the counter and prescribed medications. Seek medical evaluation if you start to experience new or abnormal symptoms after taking one of these medicines, seek care immediately if you start to experience difficulty breathing, feeling of your throat closing, facial swelling, or rash as these could be indications of a more serious allergic reaction  The application of heat can help soothe the pain.  Maintaining your daily activities, including walking, is  encourged, as it will help you get better faster than just staying in bed.  Your pain should get better over the next 2 weeks.  You will need to follow up with  Your primary healthcare provider in 1-2 weeks for reassessment, if you do not have a primary care provider one is provided in your discharge instructions- you may see the [facility] or call the provided phone number. However return to the ER should you develop ne or worsening symptoms or any other concerns including but not limited to severe or worsening pain, low back pain with fever, numbness, weakness, loss of bowel or bladder control, or inability to walk or urinate, you should return to the ER immediately.

## 2018-04-01 NOTE — ED Triage Notes (Signed)
Patient c/o urinary frequency and right low back pain x 1 week. patient denies any penile discharge.

## 2018-04-01 NOTE — ED Notes (Signed)
Bed: WA16 Expected date:  Expected time:  Means of arrival:  Comments: 

## 2018-04-01 NOTE — ED Provider Notes (Signed)
[facility]-EMERGENCY DEPT Provider Note   CSN: [id number] Arrival date & time: 04/01/18  1018    History   Chief Complaint Chief Complaint  Patient presents with   Urinary Frequency   Back Pain    HPI Zachary Stewart is a 28 y.o. male with a hx of prior R inguinal hernia repair who presents to the ER with complaints of urinary sxs x 1 week. Patient notes increased urinary frequency, some urinary urgency, and at times he feels that he only urinates a small amount. Sxs are more notable at night. No other alleviating/aggravating factors. Denies dysuria, genital lesions, hematuria, fever, chills, N/V, penile discharge, testicular pain/swelling, rectal pain, or pain with bowel movements. Patient asking if this could be his prostate. He has been sexually active w/ 2 male partners in the past 2 months, does not consistently use protection.   He also notes intermittent R lower back pain x 2 weeks. Notes he does a lot of heavy lifting at work and thinks that this is what started the discomfort. Worse with movement/certain positions. No alleviating factors. No intervention PTA. Denies numbness, tingling, weakness, saddle anesthesia, incontinence to bowel/bladder, fever, chills, IV drug use, or hx of cancer. Patient has not had prior back surgeries.       HPI  Past Medical History:  Diagnosis Date   Hernia, inguinal, right     There are no active problems to display for this patient.   Past Surgical History:  Procedure Laterality Date   HERNIA REPAIR     INGUINAL HERNIA REPAIR N/A 10/02/2017   Procedure: LAPAROSCOPIC RIGHT INGUINAL HERNIA REPAIR WITH MESH;  Surgeon: Axel Filler, MD;  Location: [facility] OR;  Service: General;  Laterality: N/A;   INSERTION OF MESH N/A 10/02/2017   Procedure: INSERTION OF MESH;  Surgeon: Axel Filler, MD;  Location: [facility] OR;  Service: General;  Laterality: N/A;   NO PAST SURGERIES          Home Medications    Prior to  Admission medications   Medication Sig Start Date End Date Taking? Authorizing Provider  HYDROcodone-acetaminophen (NORCO/VICODIN) 5-325 MG tablet Take 1 tablet by mouth every 6 (six) hours as needed for severe pain. 09/30/17   Fawze, Mina A, PA-C  traMADol (ULTRAM) 50 MG tablet Take 1 tablet (50 mg total) by mouth every 6 (six) hours as needed. 10/02/17 10/02/18  Axel Filler, MD    Family History Family History  Problem Relation Age of Onset   Anemia Mother    Hypertension Father     Social History Social History   Tobacco Use   Smoking status: Former Smoker   Smokeless tobacco: Never Used  Substance Use Topics   Alcohol use: Yes    Comment: rarely   Drug use: Yes    Types: Marijuana    Comment: occasionally     Allergies   Patient has no known allergies.   Review of Systems Review of Systems  Constitutional: Negative for chills, fever and unexpected weight change.  Gastrointestinal: Negative for abdominal pain, nausea and vomiting.  Genitourinary: Positive for frequency and urgency. Negative for discharge, dysuria, flank pain, genital sores, hematuria, penile pain, penile swelling, scrotal swelling and testicular pain.  Musculoskeletal: Positive for back pain.  Neurological: Negative for weakness and numbness.       Negative for saddle anesthesia or bowel/bladder incontinence.   All other systems reviewed and are negative.    Physical Exam Updated Vital Signs BP (!) 127/101 (BP Location: Left  Arm)    Pulse 74    Temp 98 F (36.7 C) (Oral)    Resp 16    Ht 6' (1.829 m)    Wt 81.6 kg    SpO2 100%    BMI 24.41 kg/m   Physical Exam Exam conducted with a chaperone present.  Constitutional:      General: He is not in acute distress.    Appearance: He is well-developed. He is not toxic-appearing.  HENT:     Head: Normocephalic and atraumatic.  Neck:     Musculoskeletal: Normal range of motion and neck supple. No spinous process tenderness or muscular  tenderness.  Cardiovascular:     Rate and Rhythm: Normal rate and regular rhythm.  Pulmonary:     Effort: Pulmonary effort is normal.     Breath sounds: Normal breath sounds.  Genitourinary:    Penis: No erythema or discharge.      Scrotum/Testes: Normal.     Prostate: Normal.     Rectum: Normal.  Musculoskeletal:     Comments: No obvious deformity, appreciable swelling, erythema, ecchymosis, significant open wounds, or increased warmth.  Extremities: Normal ROM. Nontender.  Back: No point/focal vertebral tenderness, no palpable step off or crepitus. R lumbar paraspinal muscle tenderness to palpation.   Skin:    General: Skin is warm and dry.     Findings: No rash.  Neurological:     Mental Status: He is alert.     Deep Tendon Reflexes:     Reflex Scores:      Patellar reflexes are 2+ on the right side and 2+ on the left side.    Comments: Sensation grossly intact to bilateral lower extremities. 5/5 symmetric strength with plantar/dorsiflexion bilaterally. 2+ symmetric patellar DTRs. Gait is intact without obvious foot drop.       ED Treatments / Results  Labs (all labs ordered are listed, but only abnormal results are displayed) Labs Reviewed  URINE CULTURE  URINALYSIS, ROUTINE W REFLEX MICROSCOPIC  GC/CHLAMYDIA PROBE AMP ([place]) NOT AT [facility]    EKG None  Radiology No results found.  Procedures Procedures (including critical care time)  Medications Ordered in ED Medications  cefTRIAXone (ROCEPHIN) injection 250 mg (250 mg Intramuscular Given 04/01/18 1211)  azithromycin (ZITHROMAX) tablet 1,000 mg (1,000 mg Oral Given 04/01/18 1211)  lidocaine (XYLOCAINE) 1 % (with pres) injection (  Given 04/01/18 1211)     Initial Impression / Assessment and Plan / ED Course  I have reviewed the triage vital signs and the nursing notes.  Pertinent labs & imaging results that were available during my care of the patient were reviewed by me and considered in my medical  decision making (see chart for details).   Patient presents to the ER for urinary sxs x 1 week and intermittent back pain x 2 weeks. Nontoxic appearing, no apparent distress, vitals WNL with the exception of elevated BP- doubt HTN emergency.   Regarding urinary sxs:  UA negative, not consistent with UTI, culture pending given patient is male. No ketonuria/glucosuria to suggest presentation for DM in otherwise health male. Prostate was checked at patient's request without obvious enlargement or bogginess, nontender. Has had unprotected intercourse- GC/chlamydia added to urine, patient declined HIV/RPR, was agreeable to prophylaxis for GC/chlamydia. H&P not consistent with orchitis, epididymitis, or prostatitis.  Unclear definitive etiology- PCP recheck.   Regarding back pain: In the setting of heavy lifting at work w/ reproducibility w/ paraspinal muscle palpation, do not feel this is related  to his urinary sxs, seems more musculoskeletal. Patient has normal neurologic exam, no point/focal midline tenderness to palpation. He is ambulatory in the ED.  No back pain red flags.  Most likely muscle strain versus spasm. Considered disc disease, UTI/pyelonephritis, kidney stone, aortic aneurysm/dissection, cauda equina or epidural abscess however these do not fit clinical picture at this time. Trial of naproxen.   I discussed results,  treatment plan, need for PCP follow-up, and return precautions with the patient. Provided opportunity for questions, patient confirmed understanding and is in agreement with plan.   Findings and plan of care discussed with supervising physician Dr. Jeraldine Loots who is in agreement.   Final Clinical Impressions(s) / ED Diagnoses   Final diagnoses:  Urinary frequency    ED Discharge Orders         Ordered    naproxen (NAPROSYN) 500 MG tablet  2 times daily     04/01/18 [address] [place], PA-C 04/01/18 1214    Gerhard Munch, MD 04/03/18 [phone]

## 2018-04-02 LAB — URINE CULTURE: Culture: NO GROWTH

## 2018-06-25 ENCOUNTER — Other Ambulatory Visit: Payer: Self-pay

## 2018-06-25 ENCOUNTER — Encounter (HOSPITAL_COMMUNITY): Payer: Self-pay

## 2018-06-25 ENCOUNTER — Ambulatory Visit (HOSPITAL_COMMUNITY)
Admission: EM | Admit: 2018-06-25 | Discharge: 2018-06-25 | Disposition: A | Discharge status: Home / Self Care | Payer: Self-pay | Attending: Family Medicine | Admitting: Family Medicine

## 2018-06-25 DIAGNOSIS — Z113 Encounter for screening for infections with a predominantly sexual mode of transmission: Secondary | ICD-10-CM | POA: Insufficient documentation

## 2018-06-25 NOTE — Discharge Instructions (Signed)
We are testing you for Gonorrhea, Chlamydia and Trichomonas, HIV, Syphillis. We will call you if anything is positive and let you know if you require any further treatment. Please inform partner of any positive results.  Please return if symptoms not improving with treatment, development of fever, nausea, vomiting, abdominal pain, scrotal pain.

## 2018-06-25 NOTE — ED Provider Notes (Signed)
[facility]    CSN: [id number] Arrival date & time: 06/25/18  1454     History   Chief Complaint Chief Complaint  Patient [id number] with  . STD Testing    HPI Zachary Stewart is a 28 y.o. male history of hernia surgery presenting today for STD screening.  Patient states that he recently had intercourse when the condom broke.  He is hoping to be evaluated for STD today in order to prevent spreading any STDs to his main partner.  He denies any symptoms of dysuria, increased frequency, or dysuria.  Denies any rashes or lesions.  Has had some occasional discomfort with straining related to his hernia surgery he had back in September 2019.  He recently followed up with his surgeon for this.  He denies any known exposures.  Also requesting HIV and syphilis.  HPI  Past Medical History:  Diagnosis Date  . Hernia, inguinal, right     There are no active problems to display for this patient.   Past Surgical History:  Procedure Laterality Date  . HERNIA REPAIR    . INGUINAL HERNIA REPAIR N/A 10/02/2017   Procedure: LAPAROSCOPIC RIGHT INGUINAL HERNIA REPAIR WITH MESH;  Surgeon: Axel Filleramirez, Armando, MD;  [facility];  Service: General;  Laterality: N/A;  . INSERTION OF MESH N/A 10/02/2017   Procedure: INSERTION OF MESH;  Surgeon: Axel Filleramirez, Armando, MD;  [facility];  Service: General;  Laterality: N/A;  . NO PAST SURGERIES         Home Medications    Prior to Admission medications   Medication Sig Start Date End Date Taking? Authorizing Provider  acetaminophen (TYLENOL) 325 MG tablet Take 650 mg by mouth every 6 (six) hours as needed for moderate pain.    [provider]  naproxen (NAPROSYN) 500 MG tablet Take 1 tablet (500 mg total) by mouth 2 (two) times daily. 04/01/18   Petrucelli, Pleas KochSamantha R, PA-C    Family History Family History  Problem Relation Age of Onset  . Anemia Mother   . Hypertension Father     Social History Social History   Tobacco Use   . Smoking status: Former Games developermoker  . Smokeless tobacco: Never Used  Substance Use Topics  . Alcohol use: Yes    Comment: rarely  . Drug use: Yes    Types: Marijuana    Comment: occasionally     Allergies   Patient has no known allergies.   Review of Systems Review of Systems  Constitutional: Negative for fever.  HENT: Negative for sore throat.   Respiratory: Negative for shortness of breath.   Cardiovascular: Negative for chest pain.  Gastrointestinal: Positive for abdominal pain. Negative for nausea and vomiting.  Genitourinary: Negative for difficulty urinating, discharge, dysuria, frequency, penile pain, penile swelling, scrotal swelling and testicular pain.  Skin: Negative for rash.  Neurological: Negative for dizziness, light-headedness and headaches.     Physical Exam Triage Vital Signs ED Triage Vitals  Enc Vitals Group     BP 06/25/18 1513 128/89     Pulse Rate 06/25/18 1513 80     Resp 06/25/18 1513 18     Temp 06/25/18 1513 99 F (37.2 C)     Temp Source 06/25/18 1513 Oral     SpO2 06/25/18 1513 99 %     Weight --      Height --      Head Circumference --      Peak Flow --  Pain Score 06/25/18 1515 0     Pain Loc --      Pain Edu? --      Excl. in [place]? --    No data found.  Updated Vital Signs BP 128/89 (BP Location: Left Arm)   Pulse 80   Temp 99 F (37.2 C) (Oral)   Resp 18   SpO2 99%   Visual Acuity Right Eye Distance:   Left Eye Distance:   Bilateral Distance:    Right Eye Near:   Left Eye Near:    Bilateral Near:     Physical Exam Vitals signs and nursing note reviewed.  Constitutional:      Appearance: He is well-developed.     Comments: No acute distress  HENT:     Head: Normocephalic and atraumatic.     Nose: Nose normal.  Eyes:     Conjunctiva/sclera: Conjunctivae normal.  Neck:     Musculoskeletal: Neck supple.  Cardiovascular:     Rate and Rhythm: Normal rate.  Pulmonary:     Effort: Pulmonary effort is normal.  No respiratory distress.  Abdominal:     General: There is no distension.     Comments: Well-healed arthroscopic scars to lower abdomen midline, mild tenderness to right lower abdomen, no focal tenderness  Genitourinary:    Comments: Deferred Musculoskeletal: Normal range of motion.  Skin:    General: Skin is warm and dry.  Neurological:     Mental Status: He is alert and oriented to person, place, and time.      UC Treatments / Results  Labs (all labs ordered are listed, but only abnormal results are displayed) Labs Reviewed  HIV ANTIBODY (ROUTINE TESTING W REFLEX)  RPR  URINE CYTOLOGY ANCILLARY ONLY    EKG None  Radiology No results found.  Procedures Procedures (including critical care time)  Medications Ordered in UC Medications - No data to display  Initial Impression / Assessment and Plan / UC Course  I have reviewed the triage vital signs and the nursing notes.  Pertinent labs & imaging results that were available during my care of the patient were reviewed by me and considered in my medical decision making (see chart for details).     Asymptomatic male requesting STD screening.  Will check for gonorrhea, chlamydia, trichomonas, HIV and syphilis.  Will defer any empiric treatment at this time and provide further treatment once results obtained.  Will call patient with results to inform.  Advised to hold off on intercourse for a full week after treatment as well as inform any partners.Discussed strict return precautions. Patient verbalized understanding and is agreeable with plan.  Final Clinical Impressions(s) / UC Diagnoses   Final diagnoses:  Screen for STD (sexually transmitted disease)     Discharge Instructions     We are testing you for Gonorrhea, Chlamydia and Trichomonas, HIV, Syphillis. We will call you if anything is positive and let you know if you require any further treatment. Please inform partner of any positive results.  Please return if  symptoms not improving with treatment, development of fever, nausea, vomiting, abdominal pain, scrotal pain.   ED Prescriptions    None     Controlled Substance Prescriptions [place] Controlled Substance Registry consulted? Not Applicable   Janith Lima, [place] 06/25/18 1553

## 2018-06-25 NOTE — ED Triage Notes (Signed)
Pt presents for STD Testing for precautionary measures after unprotected sex.  Pt states he is not having any symptoms.

## 2018-06-26 LAB — RPR: RPR Ser Ql: NONREACTIVE

## 2018-06-26 LAB — HIV ANTIBODY (ROUTINE TESTING W REFLEX): HIV Screen 4th Generation wRfx: NONREACTIVE

## 2018-06-27 LAB — URINE CYTOLOGY ANCILLARY ONLY
Chlamydia: NEGATIVE
Neisseria Gonorrhea: NEGATIVE
Trichomonas: NEGATIVE

## 2018-07-11 ENCOUNTER — Other Ambulatory Visit: Payer: Self-pay

## 2018-07-11 ENCOUNTER — Emergency Department (HOSPITAL_COMMUNITY): Payer: Self-pay

## 2018-07-11 ENCOUNTER — Emergency Department (HOSPITAL_COMMUNITY)
Admission: EM | Admit: 2018-07-11 | Discharge: 2018-07-11 | Disposition: A | Discharge status: Home / Self Care | Payer: Managed Care, Other (non HMO) | Attending: Emergency Medicine | Admitting: Emergency Medicine

## 2018-07-11 ENCOUNTER — Encounter (HOSPITAL_COMMUNITY): Payer: Self-pay | Admitting: Emergency Medicine

## 2018-07-11 DIAGNOSIS — R079 Chest pain, unspecified: Secondary | ICD-10-CM | POA: Insufficient documentation

## 2018-07-11 DIAGNOSIS — Z87891 Personal history of nicotine dependence: Secondary | ICD-10-CM | POA: Insufficient documentation

## 2018-07-11 DIAGNOSIS — F1210 Cannabis abuse, uncomplicated: Secondary | ICD-10-CM | POA: Insufficient documentation

## 2018-07-11 LAB — BASIC METABOLIC PANEL
Anion gap: 7 (ref 5–15)
BUN: 11 mg/dL (ref 6–20)
CO2: 27 mmol/L (ref 22–32)
Calcium: 9.2 mg/dL (ref 8.9–10.3)
Chloride: 107 mmol/L (ref 98–111)
Creatinine, Ser: 1.26 mg/dL — ABNORMAL HIGH (ref 0.61–1.24)
GFR calc Af Amer: >60 mL/min (ref >60)
GFR calc non Af Amer: >60 mL/min (ref >60)
Glucose, Bld: 88 mg/dL (ref 70–99)
Potassium: 4 mmol/L (ref 3.5–5.1)
Sodium: 141 mmol/L (ref 135–145)

## 2018-07-11 LAB — TROPONIN I (HIGH SENSITIVITY): Troponin I (High Sensitivity): 3 ng/L (ref <18)

## 2018-07-11 LAB — CBC
HCT: 46.7 % (ref 39.0–52.0)
Hemoglobin: 15.5 g/dL (ref 13.0–17.0)
MCH: 29.4 pg (ref 26.0–34.0)
MCHC: 33.2 g/dL (ref 30.0–36.0)
MCV: 88.4 fL (ref 80.0–100.0)
Platelets: 176 10*3/uL (ref 150–400)
RBC: 5.28 MIL/uL (ref 4.22–5.81)
RDW: 12.8 % (ref 11.5–15.5)
WBC: 8.2 10*3/uL (ref 4.0–10.5)
nRBC: 0 % (ref 0.0–0.2)

## 2018-07-11 MED ORDER — SODIUM CHLORIDE 0.9% FLUSH: 3.0000 mL | Freq: Once | INTRAVENOUS | Status: DC

## 2018-07-11 NOTE — ED Notes (Signed)
Patient verbalizes understanding of discharge instructions. Opportunity for questioning and answers were provided. Armband removed by staff, pt discharged from ED ambulatory.   
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
## 2018-07-11 NOTE — Discharge Instructions (Signed)
Your work up in the ED was reassuring and did not reveal a concerning cause of your chest pain. Follow up with a primary care doctor. You may try Tylenol or ibuprofen for ongoing pain. Return for any new or concerning symptoms. °
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
## 2018-08-08 ENCOUNTER — Ambulatory Visit (HOSPITAL_COMMUNITY)
Admission: EM | Admit: 2018-08-08 | Discharge: 2018-08-08 | Disposition: A | Discharge status: Home / Self Care | Payer: Self-pay | Attending: Internal Medicine | Admitting: Internal Medicine

## 2018-08-08 ENCOUNTER — Encounter (HOSPITAL_COMMUNITY): Payer: Self-pay | Admitting: Urgent Care

## 2018-08-08 ENCOUNTER — Other Ambulatory Visit: Payer: Self-pay

## 2018-08-08 DIAGNOSIS — Z7251 High risk heterosexual behavior: Secondary | ICD-10-CM | POA: Insufficient documentation

## 2018-08-08 DIAGNOSIS — R369 Urethral discharge, unspecified: Secondary | ICD-10-CM | POA: Insufficient documentation

## 2018-08-08 MED ORDER — AZITHROMYCIN 250 MG PO TABS
ORAL_TABLET | ORAL | Status: AC
  Filled 2018-08-08: qty 4

## 2018-08-08 MED ORDER — CEFTRIAXONE SODIUM 250 MG IJ SOLR
INTRAMUSCULAR | Status: AC
  Filled 2018-08-08: qty 250

## 2018-08-08 MED ORDER — AZITHROMYCIN 250 MG PO TABS
1000.0000 mg | ORAL_TABLET | Freq: Once | ORAL | Status: AC
  Administered 2018-08-08: 11:00:00 1000 mg via ORAL

## 2018-08-08 MED ORDER — CEFTRIAXONE SODIUM 250 MG IJ SOLR
250.0000 mg | Freq: Once | INTRAMUSCULAR | Status: AC
  Administered 2018-08-08: 250 mg via INTRAMUSCULAR

## 2018-08-08 NOTE — ED Triage Notes (Signed)
Pt presents with penile discharge and urinary urgency since Saturday.

## 2018-08-08 NOTE — ED Provider Notes (Signed)
  MRN: [id number] DOB: [birth year]  Subjective:   Zachary Stewart is a 28 y.o. male presenting for 3 day history of penile discharge from having unprotected sex with a new male partner.  Of note, patient did get tested for STIs including HIV and syphilis in June 2020.  These results were negative and does not want to get tested for the skin.  No current facility-administered medications for this encounter.   Current Outpatient Medications:  .  acetaminophen (TYLENOL) 325 MG tablet, Take 650 mg by mouth every 6 (six) hours as needed for moderate pain., Disp: , Rfl:  .  naproxen (NAPROSYN) 500 MG tablet, Take 1 tablet (500 mg total) by mouth 2 (two) times daily., Disp: 10 tablet, Rfl: 0    No Known Allergies   Past Medical History:  Diagnosis Date  . Hernia, inguinal, right      Past Surgical History:  Procedure Laterality Date  . HERNIA REPAIR    . INGUINAL HERNIA REPAIR N/A 10/02/2017   Procedure: LAPAROSCOPIC RIGHT INGUINAL HERNIA REPAIR WITH MESH;  Surgeon: Ralene Ok, MD;  Location: [place];  Service: General;  Laterality: N/A;  . INSERTION OF MESH N/A 10/02/2017   Procedure: INSERTION OF MESH;  Surgeon: Ralene Ok, MD;  Location: [place];  Service: General;  Laterality: N/A;  . NO PAST SURGERIES      ROS Denies hematuria, urinary frequency, penile swelling, testicular pain, testicular swelling, anal pain, groin pain, painless sore.   Objective:   Vitals: BP 127/81 (BP Location: Left Arm)   Pulse 62   Temp 97.8 F (36.6 C) (Oral)   Resp 16   SpO2 100%   Physical Exam Constitutional:      Appearance: Normal appearance. He is well-developed and normal weight.  HENT:     Head: Normocephalic and atraumatic.     Right Ear: External ear normal.     Left Ear: External ear normal.     Nose: Nose normal.     Mouth/Throat:     Pharynx: Oropharynx is clear.  Eyes:     Extraocular Movements: Extraocular movements intact.     Pupils: Pupils are equal, round, and  reactive to light.  Cardiovascular:     Rate and Rhythm: Normal rate.  Pulmonary:     Effort: Pulmonary effort is normal.  Neurological:     Mental Status: He is alert and oriented to person, place, and time.  Psychiatric:        Mood and Affect: Mood normal.        Behavior: Behavior normal.     Assessment and Plan :   1. Penile discharge   2. Unprotected sex     We will treat empirically per CDC guidelines for gonorrhea and chlamydia with IM ceftriaxone and azithromycin in clinic.  Counseled on safe sex practices including abstaining for 1 week following treatment.  Recommended patient get rechecked for RPR and HIV in 6 months to 1 year.  Counseled patient on potential for adverse effects with medications prescribed/recommended today, ER and return-to-clinic precautions discussed, patient verbalized understanding.    Jaynee Eagles, PA-C 08/08/18 1043

## 2018-08-08 NOTE — Discharge Instructions (Signed)
Avoid all forms of sexual intercourse (oral, vaginal, anal) for the next 7 days to avoid spreading/reinfecting. Return if symptoms worsen/do not resolve, you develop fever, abdominal pain, blood in your urine, or are re-exposed to an STI.  

## 2018-08-09 LAB — URINE CYTOLOGY ANCILLARY ONLY
Chlamydia: NEGATIVE
Neisseria Gonorrhea: NEGATIVE
Trichomonas: NEGATIVE

## 2018-10-23 ENCOUNTER — Ambulatory Visit (HOSPITAL_COMMUNITY)
Admission: EM | Admit: 2018-10-23 | Discharge: 2018-10-23 | Disposition: A | Discharge status: Home / Self Care | Payer: Self-pay | Attending: Family Medicine | Admitting: Family Medicine

## 2018-10-23 ENCOUNTER — Other Ambulatory Visit: Payer: Self-pay

## 2018-10-23 ENCOUNTER — Encounter (HOSPITAL_COMMUNITY): Payer: Self-pay

## 2018-10-23 DIAGNOSIS — Z113 Encounter for screening for infections with a predominantly sexual mode of transmission: Secondary | ICD-10-CM

## 2018-10-23 DIAGNOSIS — Z202 Contact with and (suspected) exposure to infections with a predominantly sexual mode of transmission: Secondary | ICD-10-CM

## 2018-10-23 DIAGNOSIS — R369 Urethral discharge, unspecified: Secondary | ICD-10-CM | POA: Insufficient documentation

## 2018-10-23 MED ORDER — CEFTRIAXONE SODIUM 250 MG IJ SOLR
INTRAMUSCULAR | Status: AC
  Filled 2018-10-23: qty 250

## 2018-10-23 MED ORDER — AZITHROMYCIN 250 MG PO TABS
ORAL_TABLET | ORAL | Status: AC
  Filled 2018-10-23: qty 4

## 2018-10-23 MED ORDER — AZITHROMYCIN 250 MG PO TABS
1000.0000 mg | ORAL_TABLET | Freq: Once | ORAL | Status: AC
  Administered 2018-10-23: 16:00:00 1000 mg via ORAL

## 2018-10-23 MED ORDER — CEFTRIAXONE SODIUM 250 MG IJ SOLR
250.0000 mg | Freq: Once | INTRAMUSCULAR | Status: AC
  Administered 2018-10-23: 250 mg via INTRAMUSCULAR

## 2018-10-23 NOTE — Discharge Instructions (Signed)

## 2018-10-23 NOTE — ED Provider Notes (Signed)
[facility] CARE CENTER   [id number] 10/23/18 Arrival Time: 1403  ASSESSMENT & PLAN:  1. Penile discharge     Declines HIV/RPR testing.   Discharge Instructions     You have been given the following medications today for treatment of suspected gonorrhea and/or chlamydia:  cefTRIAXone (ROCEPHIN) injection 250 mg azithromycin (ZITHROMAX) tablet 1,000 mg  Even though we have treated you today, we have sent testing for sexually transmitted infections. We will notify you of any positive results once they are received. If required, we will prescribe any medications you might need.  Please refrain from all sexual activity for at least the next seven days.     Pending: Labs Reviewed  CYTOLOGY, (ORAL, ANAL, URETHRAL) ANCILLARY ONLY    Will notify of any positive results. Instructed to refrain from sexual activity for at least seven days.  Reviewed expectations re: course of current medical issues. Questions answered. Outlined signs and symptoms indicating need for more acute intervention. Patient verbalized understanding. After Visit Summary given.   SUBJECTIVE:  Zachary Stewart is a 28 y.o. male who presents with complaint of penile discharge. Onset abrupt. First noticed 1 d ago. Describes discharge as thick and white. Denies: urinary frequency, hematuria, urinary hesitancy, urinary retention, chills and sweats. Afebrile. No abdominal or pelvic pain. No n/v. No rashes or lesions. Reports that he is sexually active with new male partner; oral sex only. OTC treatment: none.  ROS: As per HPI. All other systems negative.   OBJECTIVE:  Vitals:   10/23/18 1523  BP: 113/85  Pulse: 66  Resp: 16  Temp: 98.5 F (36.9 C)  TempSrc: Oral  SpO2: 98%     General appearance: alert, cooperative, appears stated age and no distress Throat: lips, mucosa, and tongue normal; teeth and gums normal CV: RRR Lungs: CTAB Back: no CVA tenderness; FROM at waist Abdomen: soft, non-tender GU:  normal appearing genitalia Skin: warm and dry Psychological: alert and cooperative; normal mood and affect.   Labs Reviewed  CYTOLOGY, (ORAL, ANAL, URETHRAL) ANCILLARY ONLY    No Known Allergies  Past Medical History:  Diagnosis Date  . Hernia, inguinal, right    Family History  Problem Relation Age of Onset  . Anemia Mother   . Hypertension Father    Social History   Socioeconomic History  . Marital status: Single    Spouse name: Not on file  . Number of children: Not on file  . Years of education: Not on file  . Highest education level: Not on file  Occupational History  . Not on file  Social Needs  . Financial resource strain: Not on file  . Food insecurity    Worry: Not on file    Inability: Not on file  . Transportation needs    Medical: Not on file    Non-medical: Not on file  Tobacco Use  . Smoking status: Former Games developer  . Smokeless tobacco: Never Used  Substance and Sexual Activity  . Alcohol use: Yes    Comment: rarely  . Drug use: Yes    Types: Marijuana    Comment: occasionally  . Sexual activity: Yes    Birth control/protection: None  Lifestyle  . Physical activity    Days per week: Not on file    Minutes per session: Not on file  . Stress: Not on file  Relationships  . Social Musician on phone: Not on file    Gets together: Not on file    Attends  religious service: Not on file    Active member of club or organization: Not on file    Attends meetings of clubs or organizations: Not on file    Relationship status: Not on file  . Intimate partner violence    Fear of current or ex partner: Not on file    Emotionally abused: Not on file    Physically abused: Not on file    Forced sexual activity: Not on file  Other Topics Concern  . Not on file  Social History Narrative  . Not on file          Vanessa Kick, MD 10/23/18 [phone]

## 2018-10-23 NOTE — ED Triage Notes (Signed)
Pt presents to [facility] requesting STD test due to having frequent urination x4 days. Pt denies burning on urination or discharge.

## 2018-10-25 LAB — CYTOLOGY, (ORAL, ANAL, URETHRAL) ANCILLARY ONLY
Chlamydia: NEGATIVE
Neisseria Gonorrhea: NEGATIVE
Trichomonas: NEGATIVE

## 2019-01-14 ENCOUNTER — Ambulatory Visit: Payer: HRSA Program | Attending: Internal Medicine

## 2019-01-14 DIAGNOSIS — Z20822 Contact with and (suspected) exposure to covid-19: Secondary | ICD-10-CM

## 2019-01-14 DIAGNOSIS — Z20828 Contact with and (suspected) exposure to other viral communicable diseases: Secondary | ICD-10-CM | POA: Insufficient documentation

## 2019-01-15 LAB — NOVEL CORONAVIRUS, NAA: SARS-CoV-2, NAA: NOT DETECTED

## 2019-08-30 IMAGING — US US SCROTUM W/ DOPPLER COMPLETE
1 series · 14 of 25 positions shown · non-contrast
Comparison: None.

CLINICAL DATA: Right testicular pain for 2 months

EXAM:
SCROTAL ULTRASOUND
DOPPLER ULTRASOUND OF THE TESTICLES
TECHNIQUE: Complete ultrasound examination of the testicles, epididymis, and
other scrotal structures was performed. Color and spectral Doppler
ultrasound were also utilized to evaluate blood flow to the
testicles.

[Series 1: us scrotum w/ doppler complete · 0.07mm/px · 64 acquisitions, 14 frames shown]
[im 1/64]
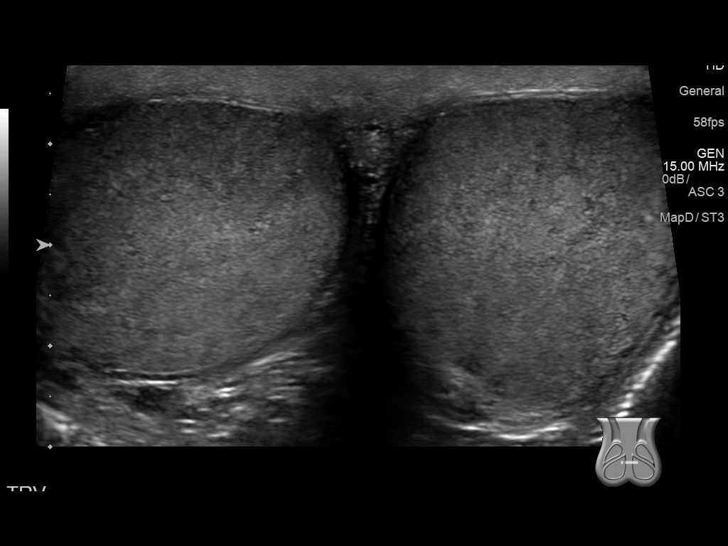
[im 6/64]
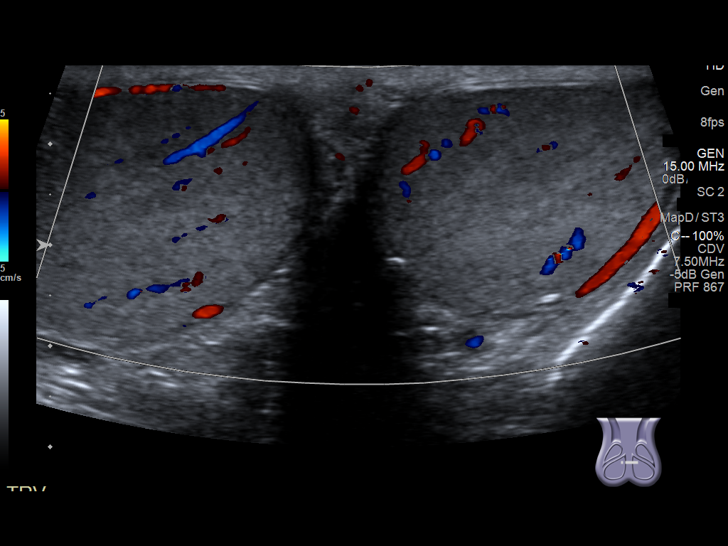
[im 11/64]
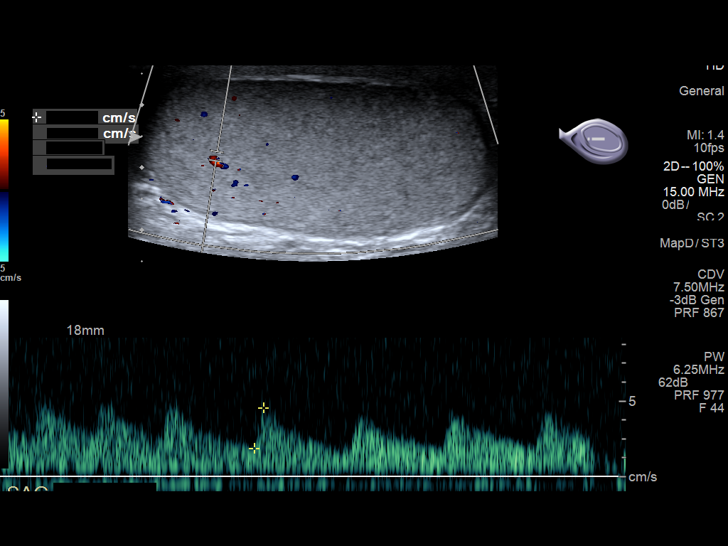
[im 16/64]
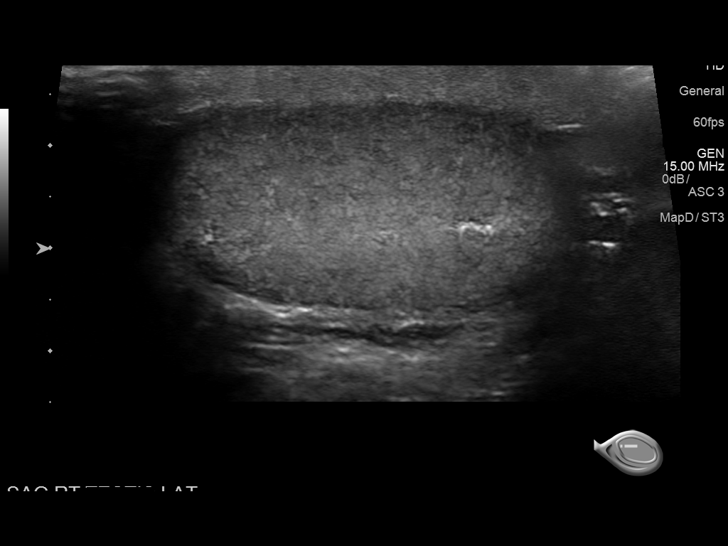
[im 22/64]
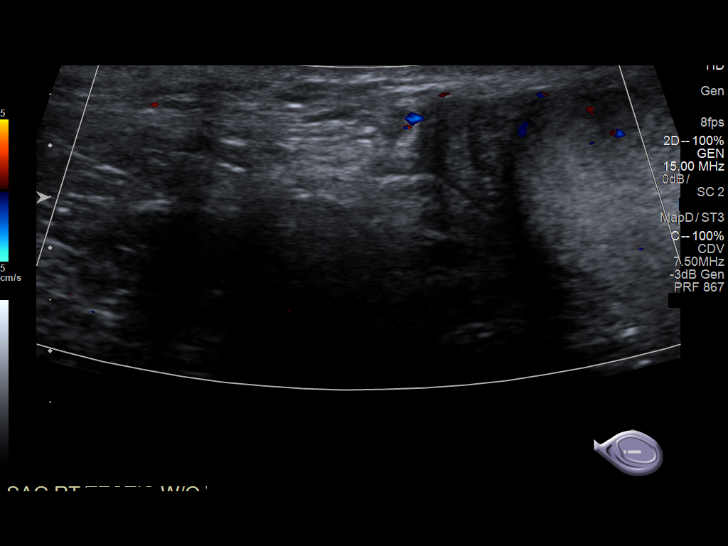
[im 24/64]
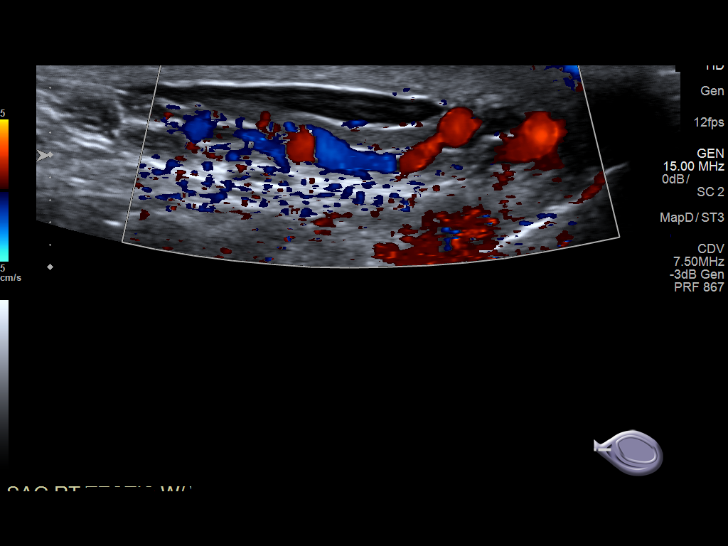
[im 29/64]
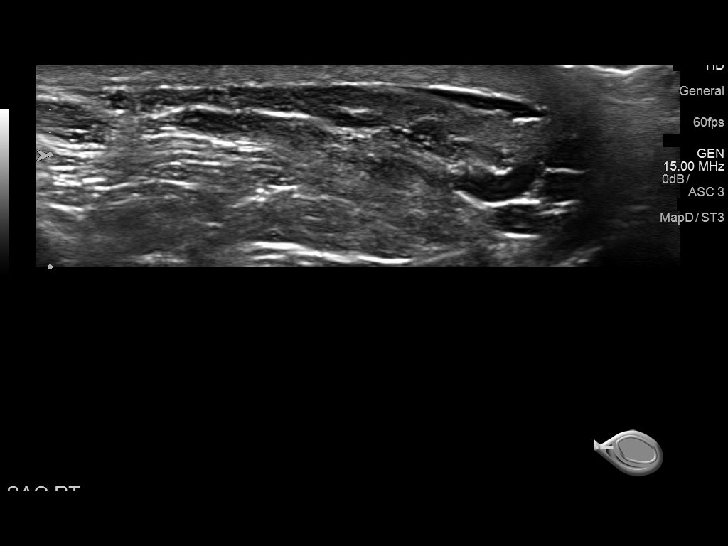
[im 35/64]
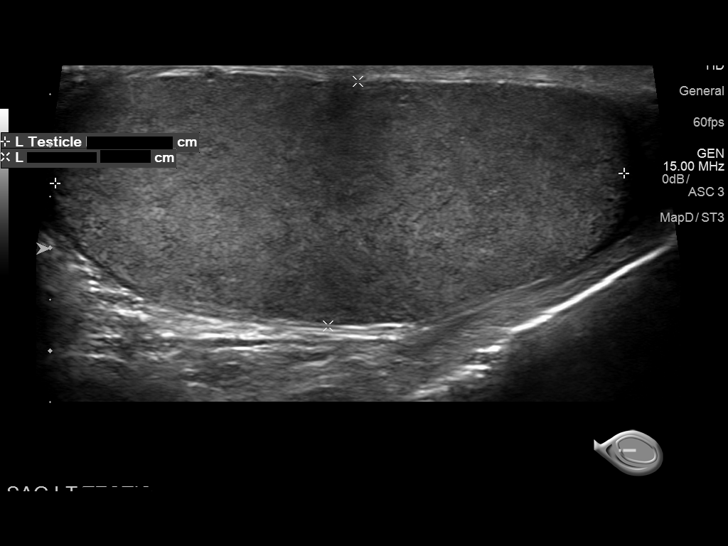
[im 40/64]
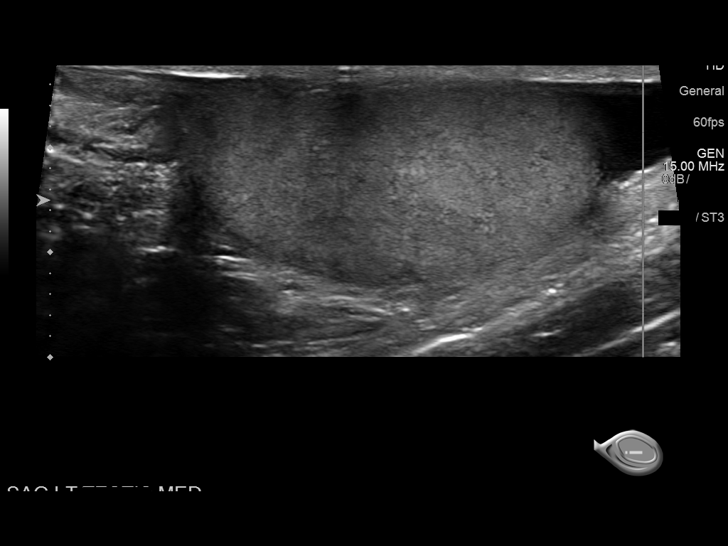
[im 43/64]
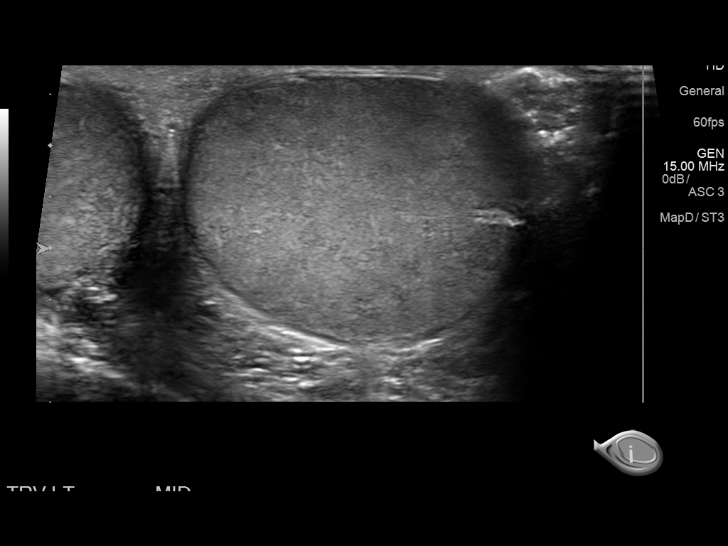
[im 48/64]
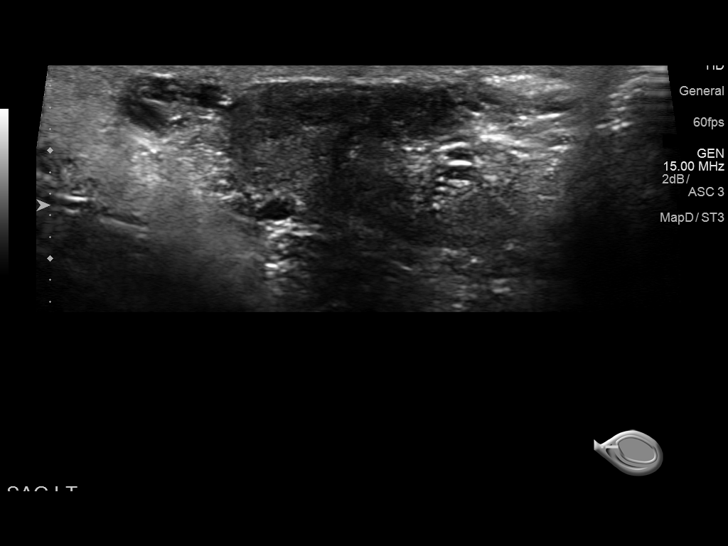
[im 53/64]
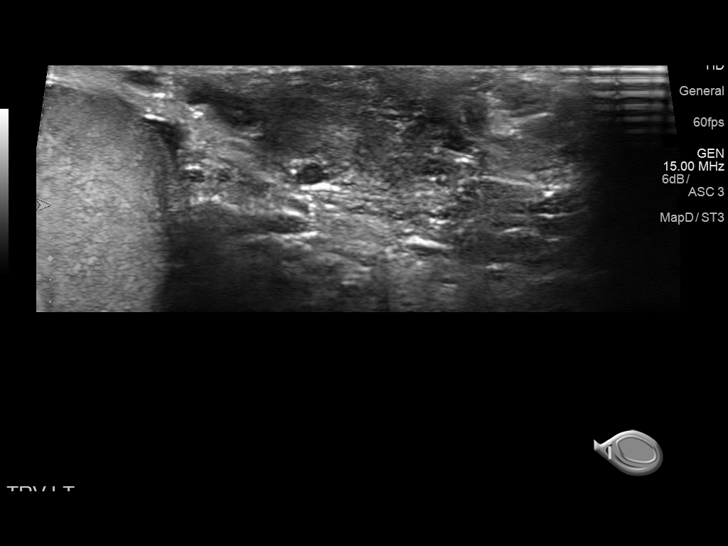
[im 58/64]
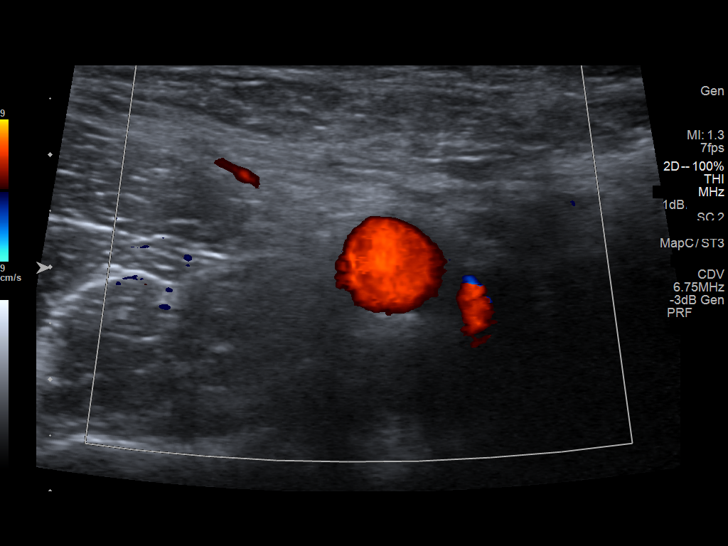
[im 64/64]
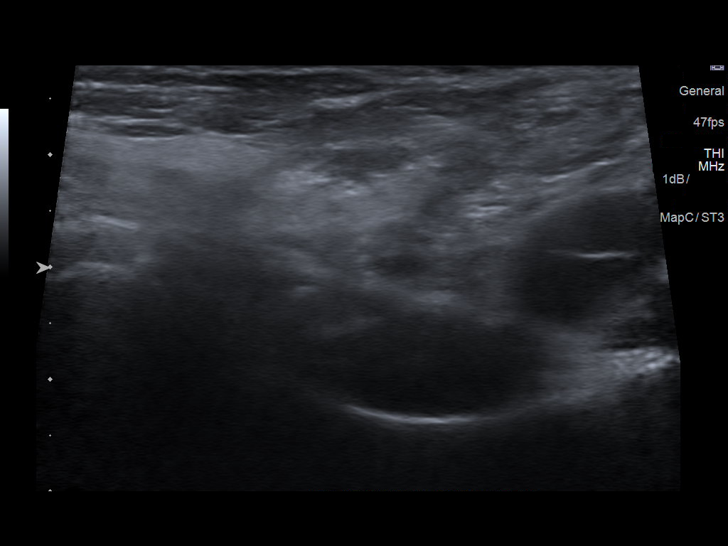

[14 of 25 positions shown; findings below may reference images not displayed]

FINDINGS: Right testicle

Measurements: 5.4 x 2.5 x 3.9 cm. No mass or microlithiasis
visualized.

Left testicle

Measurements: 5.5 x 2.4 x 3.4 cm. No mass or microlithiasis
visualized.

Right epididymis:  Normal in size and appearance.

Left epididymis:  Normal in size and appearance.

Hydrocele:  None visualized.

Varicocele:  None visualized.

Pulsed Doppler interrogation of both testes demonstrates normal low
resistance arterial and venous waveforms bilaterally.

No hernia is identified in bilateral groin.
IMPRESSION: Normal testicular ultrasound.

## 2020-01-08 ENCOUNTER — Encounter (HOSPITAL_COMMUNITY): Payer: Self-pay

## 2020-01-08 ENCOUNTER — Other Ambulatory Visit: Payer: Self-pay

## 2020-01-08 ENCOUNTER — Emergency Department (HOSPITAL_COMMUNITY)
Admission: EM | Admit: 2020-01-08 | Discharge: 2020-01-08 | Disposition: A | Discharge status: Home / Self Care | Payer: Self-pay | Attending: Emergency Medicine | Admitting: Emergency Medicine

## 2020-01-08 DIAGNOSIS — Z87891 Personal history of nicotine dependence: Secondary | ICD-10-CM | POA: Insufficient documentation

## 2020-01-08 DIAGNOSIS — K0889 Other specified disorders of teeth and supporting structures: Secondary | ICD-10-CM | POA: Insufficient documentation

## 2020-01-08 MED ORDER — HYDROCODONE-ACETAMINOPHEN 5-325 MG PO TABS
1.0000 | ORAL_TABLET | Freq: Once | ORAL | Status: AC
  Administered 2020-01-08: 1 via ORAL
  Filled 2020-01-08: qty 1

## 2020-01-08 MED ORDER — PENICILLIN V POTASSIUM 500 MG PO TABS: 500.0000 mg | ORAL_TABLET | Freq: Four times a day (QID) | ORAL | 0 refills | Status: AC

## 2020-01-08 NOTE — ED Triage Notes (Signed)
Patient c/o dental pain left upper and lower x 3-4 days.

## 2020-01-08 NOTE — Discharge Instructions (Signed)
I prescribed you an antibiotic called penicillin VK.  You are going to take this 4 times a day for the next 10 days.  Do not stop taking it early.  I recommend a combination of tylenol and ibuprofen for management of your pain. You can take a low dose of both at the same time. I recommend 325 mg of Tylenol combined with 400 mg of ibuprofen. This is one regular Tylenol and two regular ibuprofen. You can take these 2-3 times for day for your pain. Please try to take these medications with a small amount of food as well to prevent upsetting your stomach.  Continue to apply clove oil and Orajel as needed for your pain.  Please follow-up with a dentist as soon as possible for evaluation of your broken teeth.  It was a pleasure to meet you.

## 2020-01-08 NOTE — ED Provider Notes (Signed)
[facility]-EMERGENCY DEPT Provider Note   CSN: [id number] Arrival date & time: 01/08/20  1613     History Chief Complaint  Patient presents with  . Dental Pain    Zachary Stewart is a 29 y.o. male.  HPI Patient is a 29 year old male with a history of dental problems who presents the emergency department due to dental pain.  Started about 3 to 4 days ago.  Patient has a broken tooth in the left upper mouth and also notes a new broken tooth in the left lower mouth.  Reports pain in both regions.  Worsens with chewing.  Has been taking Tylenol as well as applying clove oil and Orajel with short-term relief.  Is working to schedule follow-up with a dentist in the area.  No fevers, chills, sore throat, difficulty swallowing, shortness of breath.    Past Medical History:  Diagnosis Date  . Hernia, inguinal, right     There are no problems to display for this patient.   Past Surgical History:  Procedure Laterality Date  . HERNIA REPAIR    . INGUINAL HERNIA REPAIR N/A 10/02/2017   Procedure: LAPAROSCOPIC RIGHT INGUINAL HERNIA REPAIR WITH MESH;  Surgeon: Axel Filler, MD;  Location: [facility] OR;  Service: General;  Laterality: N/A;  . INSERTION OF MESH N/A 10/02/2017   Procedure: INSERTION OF MESH;  Surgeon: Axel Filler, MD;  Location: [facility] OR;  Service: General;  Laterality: N/A;  . NO PAST SURGERIES         Family History  Problem Relation Age of Onset  . Anemia Mother   . Hypertension Father     Social History   Tobacco Use  . Smoking status: Former Games developer  . Smokeless tobacco: Never Used  Vaping Use  . Vaping Use: Never used  Substance Use Topics  . Alcohol use: Yes    Comment: rarely  . Drug use: Yes    Types: Marijuana    Comment: occasionally    Home Medications Prior to Admission medications   Medication Sig Start Date End Date Taking? Authorizing Provider  gabapentin (NEURONTIN) 100 MG capsule Take 100 mg by mouth 3 (three) times daily.     [provider]    Allergies    Patient has no known allergies.  Review of Systems   Review of Systems  Constitutional: Negative for chills and fever.  HENT: Positive for dental problem. Negative for sore throat.   Respiratory: Negative for shortness of breath.    Physical Exam Updated Vital Signs BP (!) 145/106 (BP Location: Left Arm)   Pulse 61   Temp 98.9 F (37.2 C) (Oral)   Resp 14   Ht 6' (1.829 m)   Wt 99.8 kg   SpO2 98%   BMI 29.84 kg/m   Physical Exam Vitals and nursing note reviewed.  Constitutional:      General: He is not in acute distress.    Appearance: He is well-developed.  HENT:     Head: Normocephalic and atraumatic.     Right Ear: External ear normal.     Left Ear: External ear normal.     Mouth/Throat:     Comments: Broken left upper and lower molar noted.  Moderate tenderness noted with manipulation of both teeth.  No palpable fluctuance or abscess noted.  Soft compartments.  Uvula midline.  No erythema noted the posterior oropharynx.  No hot potato voice.  Readily handling secretions. Eyes:     General: No scleral icterus.  Right eye: No discharge.        Left eye: No discharge.     Conjunctiva/sclera: Conjunctivae normal.  Neck:     Trachea: No tracheal deviation.     Comments: No nuchal rigidity.  No cervical lymphadenopathy. Cardiovascular:     Rate and Rhythm: Normal rate.  Pulmonary:     Effort: Pulmonary effort is normal. No respiratory distress.     Breath sounds: No stridor.  Abdominal:     General: There is no distension.  Musculoskeletal:        General: No swelling or deformity.     Cervical back: Neck supple. No tenderness.  Skin:    General: Skin is warm and dry.     Findings: No rash.  Neurological:     Mental Status: He is alert.     Cranial Nerves: Cranial nerve deficit: no gross deficits.     ED Results / Procedures / Treatments   Labs (all labs ordered are listed, but only abnormal results are  displayed) Labs Reviewed - No data to display  EKG None  Radiology No results found.  Procedures Procedures (including critical care time)  Medications Ordered in ED Medications  HYDROcodone-acetaminophen (NORCO/VICODIN) 5-325 MG per tablet 1 tablet (has no administration in time range)   ED Course  I have reviewed the triage vital signs and the nursing notes.  Pertinent labs & imaging results that were available during my care of the patient were reviewed by me and considered in my medical decision making (see chart for details).    MDM Rules/Calculators/A&P                          Patient is a 29 year old male who presents the emergency department with dental pain for the past 3 to 4 days.  Patient has 2 broken molars along the left side of his mouth.  He is currently working to schedule a follow-up dentist appointment.  No notable fluctuance or abscess on my exam.  No hot potato voice.  Uvula midline.  Readily handling secretions.  Exam nonconcerning for Ludwig's angina or PTA at this time.  No cervical lymphadenopathy.  Will discharge patient on a course of Veetid.  Return to the ER with worsening symptoms.  Tylenol and ibuprofen for continued management of his pain.  We discussed dosing.  His questions were answered and he was amicable at the time of discharge.  Final Clinical Impression(s) / ED Diagnoses Final diagnoses:  Pain, dental    Rx / DC Orders ED Discharge Orders         Ordered    penicillin v potassium (VEETID) 500 MG tablet  4 times daily        01/08/20 2021           Placido Sou, PA-C 01/08/20 2024    Geoffery Lyons, MD 01/08/20 2315

## 2020-06-11 ENCOUNTER — Encounter (HOSPITAL_COMMUNITY): Payer: Self-pay | Admitting: Emergency Medicine

## 2020-06-11 ENCOUNTER — Emergency Department (HOSPITAL_COMMUNITY)
Admission: EM | Admit: 2020-06-11 | Discharge: 2020-06-11 | Disposition: A | Discharge status: Home / Self Care | Payer: Self-pay | Attending: Emergency Medicine | Admitting: Emergency Medicine

## 2020-06-11 DIAGNOSIS — M67432 Ganglion, left wrist: Secondary | ICD-10-CM | POA: Insufficient documentation

## 2020-06-11 DIAGNOSIS — Z87891 Personal history of nicotine dependence: Secondary | ICD-10-CM | POA: Insufficient documentation

## 2020-06-11 NOTE — Discharge Instructions (Signed)
Contact a health care provider if: Your ganglion cyst becomes larger or more painful. You have pus coming from the lump. You have weakness or numbness in the affected area. You have a fever or chills. Get help right away if: You have a fever and have any of these in the cyst area: Increased redness. Red streaks. Swelling.

## 2020-06-11 NOTE — ED Triage Notes (Signed)
Per pt, states he has a bump on the top of his left hand-states he was diagnosed with a cyst and that it would go away-states it hasn't and it causes occasional pain

## 2020-06-11 NOTE — ED Provider Notes (Signed)
[facility]-EMERGENCY DEPT Provider Note   CSN: [id number] Arrival date & time: 06/11/20  1044     History Chief Complaint  Patient presents with  . Hand Pain    Zachary Stewart is a 30 y.o. male who presents with left wrist pain.  He has a large bump on the dorsum of his left wrist which he was told in the past was a cyst but has enlarged.  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  It is at times painful especially when he flexes the wrist and presses on the bump.  He denies any redness heat or other signs of infection.  He denies any numbness or tingling in the fingers.  He has normal grip strength.  HPI     Past Medical History:  Diagnosis Date  . Hernia, inguinal, right     There are no problems to display for this patient.   Past Surgical History:  Procedure Laterality Date  . HERNIA REPAIR    . INGUINAL HERNIA REPAIR N/A 10/02/2017   Procedure: LAPAROSCOPIC RIGHT INGUINAL HERNIA REPAIR WITH MESH;  Surgeon: Axel Filler, MD;  Location: [facility] OR;  Service: General;  Laterality: N/A;  . INSERTION OF MESH N/A 10/02/2017   Procedure: INSERTION OF MESH;  Surgeon: Axel Filler, MD;  Location: [facility] OR;  Service: General;  Laterality: N/A;  . NO PAST SURGERIES         Family History  Problem Relation Age of Onset  . Anemia Mother   . Hypertension Father     Social History   Tobacco Use  . Smoking status: Former Games developer  . Smokeless tobacco: Never Used  Vaping Use  . Vaping Use: Never used  Substance Use Topics  . Alcohol use: Yes    Comment: rarely  . Drug use: Yes    Types: Marijuana    Comment: occasionally    Home Medications Prior to Admission medications   Medication Sig Start Date End Date Taking? Authorizing Provider  gabapentin (NEURONTIN) 100 MG capsule Take 100 mg by mouth 3 (three) times daily.    [provider]    Allergies    Patient has no known allergies.  Review of  Systems   Review of Systems Ten systems reviewed and are negative for acute change, except as noted in the HPI.   Physical Exam Updated Vital Signs BP 108/70 (BP Location: Left Arm)   Pulse 78   Temp 98.2 F (36.8 C) (Oral)   Resp 16   SpO2 99%   Physical Exam Vitals and nursing note reviewed.  Constitutional:      General: He is not in acute distress.    Appearance: He is well-developed. He is not diaphoretic.  HENT:     Head: Normocephalic and atraumatic.  Eyes:     General: No scleral icterus.    Conjunctiva/sclera: Conjunctivae normal.  Cardiovascular:     Rate and Rhythm: Normal rate and regular rhythm.     Heart sounds: Normal heart sounds.  Pulmonary:     Effort: Pulmonary effort is normal. No respiratory distress.     Breath sounds: Normal breath sounds.  Abdominal:     Palpations: Abdomen is soft.     Tenderness: There is no abdominal tenderness.  Musculoskeletal:     Cervical back: Normal range of motion and neck supple.     Comments: Left wrist with large fluid-filled, fluctuant circular nodule consistent with ganglion cyst.  Normal range of motion of the fingers, normal grip strength.  Skin:    General: Skin is warm and dry.  Neurological:     Mental Status: He is alert.  Psychiatric:        Behavior: Behavior normal.     ED Results / Procedures / Treatments   Labs (all labs ordered are listed, but only abnormal results are displayed) Labs Reviewed - No data to display  EKG None  Radiology No results found.  Procedures Procedures   Medications Ordered in ED Medications - No data to display  ED Course  I have reviewed the triage vital signs and the nursing notes.  Pertinent labs & imaging results that were available during my care of the patient were reviewed by me and considered in my medical decision making (see chart for details).    MDM Rules/Calculators/A&P                          Patient with ganglion cyst. No evidence of  infection. F/U with orthopedics.  Final Clinical Impression(s) / ED Diagnoses Final diagnoses:  Ganglion cyst of dorsum of left wrist    Rx / DC Orders ED Discharge Orders    None       Arthor Captain, PA-C 06/11/20 1537    Cheryll Cockayne, MD 06/11/20 [phone]

## 2020-08-04 ENCOUNTER — Ambulatory Visit (INDEPENDENT_AMBULATORY_CARE_PROVIDER_SITE_OTHER): Payer: Self-pay | Admitting: Orthopaedic Surgery

## 2020-08-04 ENCOUNTER — Ambulatory Visit (INDEPENDENT_AMBULATORY_CARE_PROVIDER_SITE_OTHER): Payer: Self-pay

## 2020-08-04 ENCOUNTER — Encounter: Payer: Self-pay | Admitting: Orthopaedic Surgery

## 2020-08-04 DIAGNOSIS — M25532 Pain in left wrist: Secondary | ICD-10-CM

## 2020-08-04 NOTE — Progress Notes (Signed)
Office Visit Note   Patient: Zachary Stewart           Date of Birth: 13-Nov-1990           MRN: [id number] Visit Date: 08/04/2020              Requested by: No referring provider defined for this encounter. PCP: Patient, No Pcp Per (Inactive)   Assessment & Plan: Visit Diagnoses:  1. Pain in left wrist     Plan: Impression is left wrist dorsal ganglion cyst.  Today, we discussed aspiration and cortisone injection versus surgical intervention.  I recommended that he hold off on surgery until he has health insurance since it's completely fine to wait.  He would like to have this aspirated but would like to return at a later date.    Follow-Up Instructions: Return for post-op.   Orders:  Orders Placed This Encounter  Procedures   XR Wrist Complete Left   No orders of the defined types were placed in this encounter.     Procedures: No procedures performed   Clinical Data: No additional findings.   Subjective: Chief Complaint  Patient presents with   Left Wrist - Pain    HPI patient is a pleasant 30 year old right-hand-dominant gentleman who comes in today with concerns about his left wrist.  He noticed a cyst formation to the dorsal aspect about a year ago.  It has grown in size.  He has increased pain with flexion and extension of the wrist.  He notes that his symptoms worsen while at work making [employer] takes.  No previous aspiration or injection to the cyst.  Review of Systems as detailed in HPI.  All others reviewed and are negative.   Objective: Vital Signs: There were no vitals taken for this visit.  Physical Exam well-developed well-nourished gentleman in no acute distress.  Alert and oriented x3.  Ortho Exam left wrist exam shows a marble size cyst to the dorsal aspect.  This is nontender.  No skin changes.  Slight increased pain with wrist flexion and extension.  He is neurovascular intact distally.  Specialty Comments:  No specialty comments  available.  Imaging: XR Wrist Complete Left  Result Date: 08/04/2020 Soft tissue swelling dorsal wrist.  Otherwise, no acute or structural abnormalities    PMFS History: There are no problems to display for this patient.  Past Medical History:  Diagnosis Date   Hernia, inguinal, right     Family History  Problem Relation Age of Onset   Anemia Mother    Hypertension Father     Past Surgical History:  Procedure Laterality Date   HERNIA REPAIR     INGUINAL HERNIA REPAIR N/A 10/02/2017   Procedure: LAPAROSCOPIC RIGHT INGUINAL HERNIA REPAIR WITH MESH;  Surgeon: Axel Filler, MD;  Location: [facility] OR;  Service: General;  Laterality: N/A;   INSERTION OF MESH N/A 10/02/2017   Procedure: INSERTION OF MESH;  Surgeon: Axel Filler, MD;  Location: [facility] OR;  Service: General;  Laterality: N/A;   NO PAST SURGERIES     Social History   Occupational History   Not on file  Tobacco Use   Smoking status: Former   Smokeless tobacco: Never  Vaping Use   Vaping Use: Never used  Substance and Sexual Activity   Alcohol use: Yes    Comment: rarely   Drug use: Yes    Types: Marijuana    Comment: occasionally   Sexual activity: Yes    Birth control/protection:  None

## 2020-08-17 ENCOUNTER — Ambulatory Visit: Payer: Self-pay | Admitting: Orthopaedic Surgery
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
## 2022-09-05 ENCOUNTER — Encounter (HOSPITAL_COMMUNITY): Payer: Self-pay | Admitting: *Deleted

## 2022-09-05 ENCOUNTER — Ambulatory Visit (HOSPITAL_COMMUNITY)
Admission: EM | Admit: 2022-09-05 | Discharge: 2022-09-05 | Disposition: A | Discharge status: Home / Self Care | Payer: Medicaid Other | Attending: Physician Assistant | Admitting: Physician Assistant

## 2022-09-05 DIAGNOSIS — Z113 Encounter for screening for infections with a predominantly sexual mode of transmission: Secondary | ICD-10-CM | POA: Diagnosis present

## 2022-09-05 DIAGNOSIS — R350 Frequency of micturition: Secondary | ICD-10-CM | POA: Insufficient documentation

## 2022-09-05 LAB — POCT URINALYSIS DIP (MANUAL ENTRY)
Bilirubin, UA: NEGATIVE
Blood, UA: NEGATIVE
Glucose, UA: NEGATIVE mg/dL
Ketones, POC UA: NEGATIVE mg/dL
Leukocytes, UA: NEGATIVE
Nitrite, UA: NEGATIVE
Protein Ur, POC: NEGATIVE mg/dL
Spec Grav, UA: >=1.03 — AB (ref 1.010–1.025)
Urobilinogen, UA: 0.2 E.U./dL
pH, UA: 6 (ref 5.0–8.0)

## 2022-09-05 NOTE — Discharge Instructions (Signed)
Your urine was concentrated as though you are not drinking enough fluid but was otherwise normal with no evidence of infection or other abnormalities.  We will contact you if your swab is positive and we need to arrange treatment.  If you develop any additional symptoms including penile discharge, genital lesion, fever, nausea, vomiting you need to be seen immediately.
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
## 2022-09-06 LAB — CYTOLOGY, (ORAL, ANAL, URETHRAL) ANCILLARY ONLY
Chlamydia: NEGATIVE
Comment: NEGATIVE
Comment: NEGATIVE
Comment: NORMAL
Neisseria Gonorrhea: NEGATIVE
Trichomonas: NEGATIVE

## 2023-02-14 ENCOUNTER — Emergency Department (HOSPITAL_COMMUNITY): Payer: Medicaid Other

## 2023-02-14 ENCOUNTER — Emergency Department (HOSPITAL_COMMUNITY)
Admission: EM | Admit: 2023-02-14 | Discharge: 2023-02-14 | Disposition: A | Discharge status: Home / Self Care | Payer: Medicaid Other | Attending: Emergency Medicine | Admitting: Emergency Medicine

## 2023-02-14 ENCOUNTER — Other Ambulatory Visit: Payer: Self-pay

## 2023-02-14 DIAGNOSIS — S43014A Anterior dislocation of right humerus, initial encounter: Secondary | ICD-10-CM | POA: Diagnosis not present

## 2023-02-14 DIAGNOSIS — X58XXXA Exposure to other specified factors, initial encounter: Secondary | ICD-10-CM | POA: Insufficient documentation

## 2023-02-14 DIAGNOSIS — S43004A Unspecified dislocation of right shoulder joint, initial encounter: Secondary | ICD-10-CM

## 2023-02-14 DIAGNOSIS — S4991XA Unspecified injury of right shoulder and upper arm, initial encounter: Secondary | ICD-10-CM | POA: Diagnosis present

## 2023-02-14 MED ORDER — MORPHINE SULFATE (PF) 4 MG/ML IV SOLN
4.0000 mg | Freq: Once | INTRAVENOUS | Status: AC
  Administered 2023-02-14: 4 mg via INTRAVENOUS
  Filled 2023-02-14: qty 1

## 2023-02-14 MED ORDER — HYDROCODONE-ACETAMINOPHEN 5-325 MG PO TABS
1.0000 | ORAL_TABLET | Freq: Once | ORAL | Status: AC
  Administered 2023-02-14: 1 via ORAL
  Filled 2023-02-14: qty 1

## 2023-02-14 MED ORDER — PROPOFOL 10 MG/ML IV BOLUS
INTRAVENOUS | Status: AC
  Filled 2023-02-14: qty 20

## 2023-02-14 MED ORDER — LIDOCAINE HCL (PF) 1 % IJ SOLN
20.0000 mL | Freq: Once | INTRAMUSCULAR | Status: AC
  Administered 2023-02-14: 20 mL
  Filled 2023-02-14: qty 30

## 2023-02-14 MED ORDER — ONDANSETRON HCL 4 MG/2ML IJ SOLN
4.0000 mg | Freq: Once | INTRAMUSCULAR | Status: AC
  Administered 2023-02-14: 4 mg via INTRAVENOUS
  Filled 2023-02-14: qty 2

## 2023-02-14 NOTE — Progress Notes (Signed)
Orthopedic Tech Progress Note Patient Details:  Zachary Stewart 1990/03/12 [id number]  Ortho Devices Type of Ortho Device: Shoulder immobilizer Ortho Device/Splint Location: right Ortho Device/Splint Interventions: Ordered, Application, Adjustment   Post Interventions Patient Tolerated: Well Instructions Provided: Adjustment of device, Care of device  Kizzie Fantasia 02/14/2023, 2:03 PM

## 2023-02-14 NOTE — ED Triage Notes (Signed)
Patient to ED by POV for shoulder dislocation. He reports reaching back and hearing his arm pop out of place.

## 2023-02-14 NOTE — ED Provider Notes (Signed)
[place] EMERGENCY DEPARTMENT AT [facility] Provider Note   CSN: [id number] Arrival date & time: 02/14/23  7829     History  Chief Complaint  Patient presents with   Shoulder Injury    Zachary Stewart is a 33 y.o. male presenting to the ED with right shoulder pain.  Patient reports that he was reaching over the backseat of the car earlier today and felt the shoulder pop out.  This occurred approximately 1 hour prior to arrival.  He denies prior history of dislocation.  Denies any other significant medical problems.  He did have water today but has not eaten anything  HPI     Home Medications Prior to Admission medications   Not on File      Allergies    Patient has no known allergies.    Review of Systems   Review of Systems  Physical Exam Updated Vital Signs BP (!) 141/98 (BP Location: Left Arm)   Pulse 81   Temp 97.9 F (36.6 C) (Oral)   Resp 18   Ht 6' (1.829 m)   Wt 98 kg   SpO2 99%   BMI 29.30 kg/m  Physical Exam Constitutional:      General: He is not in acute distress. HENT:     Head: Normocephalic and atraumatic.  Eyes:     Conjunctiva/sclera: Conjunctivae normal.     Pupils: Pupils are equal, round, and reactive to light.  Cardiovascular:     Rate and Rhythm: Normal rate and regular rhythm.     Pulses: Normal pulses.  Pulmonary:     Effort: Pulmonary effort is normal. No respiratory distress.  Musculoskeletal:     Comments: Right shoulder is squared off, limited range of motion due to tenderness and pain  Skin:    General: Skin is warm and dry.  Neurological:     General: No focal deficit present.     Mental Status: He is alert. Mental status is at baseline.  Psychiatric:        Mood and Affect: Mood normal.        Behavior: Behavior normal.     ED Results / Procedures / Treatments   Labs (all labs ordered are listed, but only abnormal results are displayed) Labs Reviewed - No data to display  EKG None  Radiology DG  Shoulder Right Result Date: 02/14/2023 CLINICAL DATA:  Right shoulder pain. EXAM: RIGHT SHOULDER - 2+ VIEW COMPARISON:  None Available. FINDINGS: There is anterior glenohumeral dislocation with impaction of the posterolateral humeral head, compatible with a Hill-Sachs deformity. The glenoid appears grossly intact. IMPRESSION: Anterior glenohumeral dislocation with Hill-Sachs deformity. Electronically Signed   By: Hart Robinsons M.D.   On: 02/14/2023 12:05    Procedures .Reduction of dislocation  Date/Time: 02/14/2023 2:04 PM  Performed by: Terald Sleeper, MD Authorized by: Terald Sleeper, MD  Consent: Verbal consent obtained. Written consent not obtained. Risks and benefits: risks, benefits and alternatives were discussed Consent given by: patient Patient understanding: patient states understanding of the procedure being performed Patient consent: the patient's understanding of the procedure matches consent given Procedure consent: procedure consent matches procedure scheduled Relevant documents: relevant documents present and verified Test results: test results available and properly labeled Site marked: the operative site was marked Imaging studies: imaging studies available Required items: required blood products, implants, devices, and special equipment available Patient identity confirmed: hospital-assigned identification number Time out: Immediately prior to procedure a "time out" was called to verify the  correct patient, procedure, equipment, support staff and site/side marked as required. Preparation: Patient was prepped and draped in the usual sterile fashion. Local anesthesia used: yes Anesthesia: local infiltration  Anesthesia: Local anesthesia used: yes Local Anesthetic: lidocaine 1% without epinephrine Anesthetic total: 20 mL  Sedation: Patient sedated: no  Patient tolerance: patient tolerated the procedure well with no immediate complications        Medications Ordered in ED Medications  HYDROcodone-acetaminophen (NORCO/VICODIN) 5-325 MG per tablet 1 tablet (1 tablet Oral Given 02/14/23 1045)  morphine (PF) 4 MG/ML injection 4 mg (4 mg Intravenous Given 02/14/23 1152)  ondansetron (ZOFRAN) injection 4 mg (4 mg Intravenous Given 02/14/23 1152)  lidocaine (PF) (XYLOCAINE) 1 % injection 20 mL (20 mLs Other Given by Other 02/14/23 1240)    ED Course/ Medical Decision Making/ A&P Clinical Course as of 02/14/23 1405  Wed Feb 14, 2023  1321 Attempted bedside reduction after lidocaine injection - improved ROM, will repeat xray shoulder [MT]  1403 Shoulder appropriately relocated, pt in sling, okay for discharge [MT]    Clinical Course User Index [MT] Aveah Castell, Kermit Balo, MD                                 Medical Decision Making Amount and/or Complexity of Data Reviewed Radiology: ordered.  Risk Prescription drug management.   Patient is here with suspected anterior right shoulder dislocation, confirmed on x-ray, per my own review of the x-ray imaging.  He is neurovascularly intact.  I did attempt to inject intra-articular lidocaine into the right shoulder, and achieved significant anesthesia, allowing me to perform bedside reduction of the right arm.  On repeat x-ray appears to be appropriately relocated.  He is neurovascularly intact.  Will place in a sling, okay for discharge        Final Clinical Impression(s) / ED Diagnoses Final diagnoses:  Dislocation of right shoulder joint, initial encounter    Rx / DC Orders ED Discharge Orders     None         Tanaka Gillen, Kermit Balo, MD 02/14/23 [phone]

## 2023-02-14 NOTE — Discharge Instructions (Addendum)
Please wear the arm sling for the next 2 days.  Avoid any sudden jerking movements with your arms or shoulders, including stretching overhead, for the next 7 days, and be mindful of these movements in the future.  You can take over-the-counter Tylenol and ibuprofen as needed for pain for the next few days.
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
## 2023-03-24 ENCOUNTER — Encounter (HOSPITAL_COMMUNITY): Payer: Self-pay | Admitting: Emergency Medicine

## 2023-03-24 ENCOUNTER — Other Ambulatory Visit: Payer: Self-pay

## 2023-03-24 ENCOUNTER — Emergency Department (HOSPITAL_COMMUNITY)
Admission: EM | Admit: 2023-03-24 | Discharge: 2023-03-24 | Disposition: A | Discharge status: Home / Self Care | Attending: Emergency Medicine | Admitting: Emergency Medicine

## 2023-03-24 DIAGNOSIS — R1030 Lower abdominal pain, unspecified: Secondary | ICD-10-CM | POA: Diagnosis present

## 2023-03-24 DIAGNOSIS — R1031 Right lower quadrant pain: Secondary | ICD-10-CM

## 2023-03-24 NOTE — ED Triage Notes (Signed)
 Patient presents due to sharp pain in his groin on the right side with various activities. He has had pain for years since his hernia surgery, but it has worsened over the past few days. Marland Kitchen

## 2023-03-24 NOTE — ED Provider Notes (Signed)
 [place] EMERGENCY DEPARTMENT AT [facility] Provider Note   CSN: [id number] Arrival date & time: 03/24/23  1906     History  Chief Complaint  Patient presents with   Groin Pain    Zachary Stewart is a 33 y.o. male.  33 yo M with a chief complaint of right groin pain.  He told me he had a right inguinal hernia repair done 6 years ago.  Since then has had pain to the area off and on.  He said when he is in certain positions suddenly he will get a sharp pain there.  He is worried that something is ripping or tearing on the inside.  Denies recent injury.  Denies urinary symptoms.  Does seem to occur with sitting or bending over.  No bulges or masses that he has noticed.   Groin Pain       Home Medications Prior to Admission medications   Not on File      Allergies    Patient has no known allergies.    Review of Systems   Review of Systems  Physical Exam Updated Vital Signs BP 129/89 (BP Location: Left Arm)   Pulse 81   Temp 98.4 F (36.9 C) (Oral)   Resp 19   Ht 6' (1.829 m)   Wt 98 kg   SpO2 99%   BMI 29.29 kg/m  Physical Exam Vitals and nursing note reviewed.  Constitutional:      Appearance: He is well-developed.  HENT:     Head: Normocephalic and atraumatic.  Eyes:     Pupils: Pupils are equal, round, and reactive to light.  Neck:     Vascular: No JVD.  Cardiovascular:     Rate and Rhythm: Normal rate and regular rhythm.     Heart sounds: No murmur heard.    No friction rub. No gallop.  Pulmonary:     Effort: No respiratory distress.     Breath sounds: No wheezing.  Abdominal:     General: There is no distension.     Tenderness: There is no abdominal tenderness. There is no guarding or rebound.  Genitourinary:      Comments: Area the patient says he has discomfort.  I do not appreciate any obvious abnormality there.  Femoral pulse intact.  No obvious pain at the attachment of the hip adductor muscles to the pubic  symphysis. Musculoskeletal:        General: Normal range of motion.     Cervical back: Normal range of motion and neck supple.  Skin:    Coloration: Skin is not pale.     Findings: No rash.  Neurological:     Mental Status: He is alert and oriented to person, place, and time.  Psychiatric:        Behavior: Behavior normal.     ED Results / Procedures / Treatments   Labs (all labs ordered are listed, but only abnormal results are displayed) Labs Reviewed - No data to display  EKG None  Radiology No results found.  Procedures Procedures    Medications Ordered in ED Medications - No data to display  ED Course/ Medical Decision Making/ A&P                                 Medical Decision Making  33 yo M with a chief complaints of right groin pain.  This has been going on for  years.  He is worried that maybe something is wrong on the inside.  I do not find any obvious abnormality on exam.  I encouraged him to follow-up with his general surgeon and his PCP.  9:01 PM:  I have discussed the diagnosis/risks/treatment options with the patient.  Evaluation and diagnostic testing in the emergency department does not suggest an emergent condition requiring admission or immediate intervention beyond what has been performed at this time.  They will follow up with PCP, Gen surgery. We also discussed returning to the ED immediately if new or worsening sx occur. We discussed the sx which are most concerning (e.g., sudden worsening pain, fever, inability to tolerate by mouth) that necessitate immediate return. Medications administered to the patient during their visit and any new prescriptions provided to the patient are listed below.  Medications given during this visit Medications - No data to display   The patient appears reasonably screen and/or stabilized for discharge and I doubt any other medical condition or other [facility] requiring further screening, evaluation, or treatment in the ED  at this time prior to discharge.          Final Clinical Impression(s) / ED Diagnoses Final diagnoses:  Right inguinal pain    Rx / DC Orders ED Discharge Orders     None         Melene Plan, DO 03/24/23 2101

## 2023-03-24 NOTE — Discharge Instructions (Signed)
 I think you might have the irritated nerve in there or perhaps you have a recurrent groin strain.  Please follow-up with the general surgeons and with the family doctors in the office.  You may benefit from physical therapy.  Take 4 over the counter ibuprofen tablets 3 times a day or 2 over-the-counter naproxen tablets twice a day for pain. Also take tylenol 1000mg (2 extra strength) four times a day.

## 2023-05-11 NOTE — Progress Notes (Signed)
 Chief Complaint  Patient presents with  . New Consultation    R inguinal pain    Subjective  Zachary Stewart is a 33 y.o. male who presents for New Consultation (R inguinal pain) HPI History of Present Illness Zachary Stewart, a 33 year old male with a past medical history of hernia repair in 2015, presents with intermittent groin pain. He reports a sharp pain in the groin area that occurs sporadically. The pain is not associated with any specific activity but has been noticed during periods of prolonged standing at work and while sitting in certain positions. He denies any other associated symptoms.  The patient also discusses his family history of high blood pressure, noting that his father and brother both have the condition. He expresses concern about this being hereditary and affecting his health in the future.  Zachary Stewart has been dealing with significant life stressors, including the loss of his brother in 2020 and gaining full custody of his son. He has been proactive in managing his mental health, with plans to start therapy in June. He also mentions a lifestyle change, including giving up smoking and focusing on his physical health.  He has a history of a hernia repair in 2015 and is concerned about a potential recurrence due to the intermittent groin pain. He denies any other symptoms suggestive of a hernia recurrence.  Review of Systems  Constitutional:  Negative for chills and fever.  HENT:  Negative for ear pain and sore throat.   Eyes:  Negative for pain and visual disturbance.  Respiratory:  Negative for cough and shortness of breath.   Cardiovascular:  Negative for chest pain and palpitations.  Gastrointestinal:  Negative for abdominal pain and vomiting.  Genitourinary:  Negative for dysuria and hematuria.  Musculoskeletal:  Negative for arthralgias and back pain.  Skin:  Negative for color change and rash.  Neurological:  Negative for seizures and syncope.  All other systems reviewed  and are negative.   There is no problem list on file for this patient.   No outpatient medications prior to visit.   No facility-administered medications prior to visit.      Objective  Vitals:   05/11/23 1127 05/11/23 1129  Pulse: 69   Temp: 37.1 C (98.8 F)   SpO2: 98%   Weight: 94.9 kg (209 lb 3.2 oz)   Height: 182.9 cm (6')   PainSc:  0-No pain   Body mass index is 28.37 kg/m.  Home Vitals:     Physical Exam Physical Exam GENITOURINARY: No inguinal hernia or bulge in the groin area.  PE:  Constitutional: No acute distress, conversant, appears states age. Eyes: Anicteric sclerae, moist conjunctiva, no lid lag Lungs: Clear to auscultation bilaterally, normal respiratory effort CV: regular rate and rhythm, no murmurs, no peripheral edema, pedal pulses 2+ GI: Soft, no masses or hepatosplenomegaly, non-tender to palpation, no hernia on valsalva Skin: No rashes, palpation reveals normal turgor Psychiatric: appropriate judgment and insight, oriented to person, place, and time  Results      Assessment/Plan:   Assessment & Plan Groin pain   He experiences intermittent sharp groin pain near the site of a previous hernia repair. There is no hernia recurrence. Possible causes include scar tissue, muscle tightness, or referred pain from the back. The mesh is well incorporated, and no surgical intervention is needed. He should avoid activities that exacerbate the pain. Consider physical therapy if symptoms persist. Follow up if symptoms worsen or new symptoms develop.  Diagnoses and all orders  for this visit:  Inguinal pain, right           Return if symptoms worsen or fail to improve.     Future Appointments   This patient does not currently have any appointments scheduled.     There are no Patient Instructions on file for this visit.  An after visit summary was provided for the patient either in written format (printed) or through [facility].  This note has been created using automated tools and reviewed for accuracy by [facility].

## 2023-06-17 ENCOUNTER — Emergency Department (HOSPITAL_COMMUNITY)
Admission: EM | Admit: 2023-06-17 | Discharge: 2023-06-17 | Disposition: A | Discharge status: Home / Self Care | Attending: Emergency Medicine | Admitting: Emergency Medicine

## 2023-06-17 ENCOUNTER — Emergency Department (HOSPITAL_COMMUNITY)

## 2023-06-17 DIAGNOSIS — R0602 Shortness of breath: Secondary | ICD-10-CM | POA: Insufficient documentation

## 2023-06-17 DIAGNOSIS — R079 Chest pain, unspecified: Secondary | ICD-10-CM | POA: Diagnosis not present

## 2023-06-17 DIAGNOSIS — F419 Anxiety disorder, unspecified: Secondary | ICD-10-CM | POA: Insufficient documentation

## 2023-06-17 LAB — BASIC METABOLIC PANEL WITH GFR
Anion gap: 16 — ABNORMAL HIGH (ref 5–15)
BUN: 14 mg/dL (ref 6–20)
CO2: 17 mmol/L — ABNORMAL LOW (ref 22–32)
Calcium: 10 mg/dL (ref 8.9–10.3)
Chloride: 106 mmol/L (ref 98–111)
Creatinine, Ser: 1.18 mg/dL (ref 0.61–1.24)
GFR, Estimated: >60 mL/min (ref >60)
Glucose, Bld: 101 mg/dL — ABNORMAL HIGH (ref 70–99)
Potassium: 3.5 mmol/L (ref 3.5–5.1)
Sodium: 139 mmol/L (ref 135–145)

## 2023-06-17 LAB — HEPATIC FUNCTION PANEL
ALT: 21 U/L (ref 0–44)
AST: 28 U/L (ref 15–41)
Albumin: 5 g/dL (ref 3.5–5.0)
Alkaline Phosphatase: 51 U/L (ref 38–126)
Bilirubin, Direct: 0.2 mg/dL (ref 0.0–0.2)
Indirect Bilirubin: 1.6 mg/dL — ABNORMAL HIGH (ref 0.3–0.9)
Total Bilirubin: 1.8 mg/dL — ABNORMAL HIGH (ref 0.0–1.2)
Total Protein: 7.9 g/dL (ref 6.5–8.1)

## 2023-06-17 LAB — CBC
HCT: 46.8 % (ref 39.0–52.0)
Hemoglobin: 16.2 g/dL (ref 13.0–17.0)
MCH: 29.4 pg (ref 26.0–34.0)
MCHC: 34.6 g/dL (ref 30.0–36.0)
MCV: 84.9 fL (ref 80.0–100.0)
Platelets: 243 10*3/uL (ref 150–400)
RBC: 5.51 MIL/uL (ref 4.22–5.81)
RDW: 12.9 % (ref 11.5–15.5)
WBC: 11 10*3/uL — ABNORMAL HIGH (ref 4.0–10.5)
nRBC: 0 % (ref 0.0–0.2)

## 2023-06-17 LAB — ETHANOL: Alcohol, Ethyl (B): <15 mg/dL (ref <15)

## 2023-06-17 LAB — TROPONIN I (HIGH SENSITIVITY): Troponin I (High Sensitivity): 3 ng/L (ref <18)

## 2023-06-17 MED ORDER — LORAZEPAM 2 MG/ML IJ SOLN
1.0000 mg | Freq: Once | INTRAMUSCULAR | Status: AC
  Administered 2023-06-17: 1 mg via INTRAVENOUS
  Filled 2023-06-17: qty 1

## 2023-06-17 MED ORDER — HYDROXYZINE HCL 25 MG PO TABS: 25.0000 mg | ORAL_TABLET | Freq: Three times a day (TID) | ORAL | 0 refills | Status: DC | PRN

## 2023-06-17 NOTE — ED Notes (Addendum)
 Mother called and would like to speak with doctor, she is concerned for patients mental health, states that pt constantly is crying and has made suicidal thoughts. Pt keeps telling his mother and his girlfriend that he is having a heart attack. Pt denies SI/HI, states that I just smoke some weed and drink alcohol.

## 2023-06-17 NOTE — Discharge Instructions (Signed)
Follow-up with your doctor tomorrow as planned 

## 2023-06-17 NOTE — ED Triage Notes (Signed)
 Patient found praying in EMS bay after driving and parking. Clammy. Reports "Im praying". Denies pain. Denies drugs/alcohol.

## 2023-06-17 NOTE — ED Notes (Signed)
 Pt in room yelling on the phone, reports he is yelling at his mother and "this is why I am like this" pt reports to his mother that he has had two heart attacks and she does not believe him and his father is a murderer. Pt advised to get off the phone since he is upset and yelling.

## 2023-06-17 NOTE — ED Provider Notes (Signed)
 [place] EMERGENCY DEPARTMENT AT [facility] Provider Note   CSN: [id number] Arrival date & time: 06/17/23  1833     History {Add pertinent medical, surgical, social history, OB history to HPI:1} Chief Complaint  Patient presents with   Anxiety   Chest Pain    Zachary Stewart is a 33 y.o. male.  Patient presented today complaining of some chest discomfort and being anxious.   Anxiety Associated symptoms include chest pain.  Chest Pain Associated symptoms: anxiety        Home Medications Prior to Admission medications   Medication Sig Start Date End Date Taking? Authorizing Provider  hydrOXYzine (ATARAX) 25 MG tablet Take 1 tablet (25 mg total) by mouth every 8 (eight) hours as needed for anxiety. 06/17/23  Yes Cheyenne Cotta, MD      Allergies    Patient has no known allergies.    Review of Systems   Review of Systems  Cardiovascular:  Positive for chest pain.    Physical Exam Updated Vital Signs BP 117/83   Pulse 70   Temp 98 F (36.7 C) (Oral)   Resp 18   SpO2 97%  Physical Exam  ED Results / Procedures / Treatments   Labs (all labs ordered are listed, but only abnormal results are displayed) Labs Reviewed  BASIC METABOLIC PANEL WITH GFR - Abnormal; Notable for the following components:      Result Value   CO2 17 (*)    Glucose, Bld 101 (*)    Anion gap 16 (*)    All other components within normal limits  CBC - Abnormal; Notable for the following components:   WBC 11.0 (*)    All other components within normal limits  HEPATIC FUNCTION PANEL - Abnormal; Notable for the following components:   Total Bilirubin 1.8 (*)    Indirect Bilirubin 1.6 (*)    All other components within normal limits  ETHANOL  RAPID URINE DRUG SCREEN, HOSP PERFORMED  TROPONIN I (HIGH SENSITIVITY)  TROPONIN I (HIGH SENSITIVITY)    EKG None  Radiology DG Chest Port 1 View Result Date: 06/17/2023 CLINICAL DATA:  Shortness of breath and clammy. EXAM: PORTABLE  CHEST 1 VIEW COMPARISON:  07/11/2018 FINDINGS: The heart size and mediastinal contours are within normal limits. Both lungs are clear. The visualized skeletal structures are unremarkable. IMPRESSION: No active disease. Electronically Signed   By: Boyce Byes M.D.   On: 06/17/2023 19:42    Procedures Procedures  {Document cardiac monitor, telemetry assessment procedure when appropriate:1}  Medications Ordered in ED Medications  LORazepam (ATIVAN) injection 1 mg (1 mg Intravenous Given 06/17/23 1855)    ED Course/ Medical Decision Making/ A&P   {   Click here for ABCD2, HEART and other calculatorsREFRESH Note before signing :1}                              Medical Decision Making Amount and/or Complexity of Data Reviewed Labs: ordered. Radiology: ordered. ECG/medicine tests: ordered.  Risk Prescription drug management.   Patient with anxiety.  He is put on Vistaril and needs to follow-up tomorrow with his doctor  {Document critical care time when appropriate:1} {Document review of labs and clinical decision tools ie heart score, Chads2Vasc2 etc:1}  {Document your independent review of radiology images, and any outside records:1} {Document your discussion with family members, caretakers, and with consultants:1} {Document social determinants of health affecting pt's care:1} {Document your decision making  why or why not admission, treatments were needed:1} Final Clinical Impression(s) / ED Diagnoses Final diagnoses:  Anxiety    Rx / DC Orders ED Discharge Orders          Ordered    hydrOXYzine (ATARAX) 25 MG tablet  Every 8 hours PRN        06/17/23 2148

## 2023-06-18 ENCOUNTER — Ambulatory Visit: Admitting: Nurse Practitioner

## 2023-08-10 ENCOUNTER — Emergency Department (HOSPITAL_COMMUNITY)
Admission: EM | Admit: 2023-08-10 | Discharge: 2023-08-10 | Discharge status: Against Medical Advice | Attending: Emergency Medicine | Admitting: Emergency Medicine

## 2023-08-10 ENCOUNTER — Other Ambulatory Visit: Payer: Self-pay

## 2023-08-10 ENCOUNTER — Emergency Department (HOSPITAL_COMMUNITY)
Admission: EM | Admit: 2023-08-10 | Discharge: 2023-08-10 | Disposition: A | Discharge status: Home / Self Care | Source: Home / Self Care

## 2023-08-10 DIAGNOSIS — F419 Anxiety disorder, unspecified: Secondary | ICD-10-CM | POA: Insufficient documentation

## 2023-08-10 DIAGNOSIS — Z79899 Other long term (current) drug therapy: Secondary | ICD-10-CM | POA: Diagnosis not present

## 2023-08-10 DIAGNOSIS — Z5321 Procedure and treatment not carried out due to patient leaving prior to being seen by health care provider: Secondary | ICD-10-CM | POA: Diagnosis not present

## 2023-08-10 LAB — COMPREHENSIVE METABOLIC PANEL WITH GFR
ALT: 14 U/L (ref 0–44)
AST: 23 U/L (ref 15–41)
Albumin: 3.6 g/dL (ref 3.5–5.0)
Alkaline Phosphatase: 37 U/L — ABNORMAL LOW (ref 38–126)
Anion gap: 9 (ref 5–15)
BUN: 9 mg/dL (ref 6–20)
CO2: 22 mmol/L (ref 22–32)
Calcium: 9 mg/dL (ref 8.9–10.3)
Chloride: 108 mmol/L (ref 98–111)
Creatinine, Ser: 1.12 mg/dL (ref 0.61–1.24)
GFR, Estimated: >60 mL/min (ref >60)
Glucose, Bld: 127 mg/dL — ABNORMAL HIGH (ref 70–99)
Potassium: 3.7 mmol/L (ref 3.5–5.1)
Sodium: 139 mmol/L (ref 135–145)
Total Bilirubin: 1.3 mg/dL — ABNORMAL HIGH (ref 0.0–1.2)
Total Protein: 5.8 g/dL — ABNORMAL LOW (ref 6.5–8.1)

## 2023-08-10 LAB — CBC
HCT: 45.2 % (ref 39.0–52.0)
Hemoglobin: 15.6 g/dL (ref 13.0–17.0)
MCH: 29.9 pg (ref 26.0–34.0)
MCHC: 34.5 g/dL (ref 30.0–36.0)
MCV: 86.8 fL (ref 80.0–100.0)
Platelets: 223 K/uL (ref 150–400)
RBC: 5.21 MIL/uL (ref 4.22–5.81)
RDW: 13.2 % (ref 11.5–15.5)
WBC: 9.1 K/uL (ref 4.0–10.5)
nRBC: 0 % (ref 0.0–0.2)

## 2023-08-10 LAB — RAPID URINE DRUG SCREEN, HOSP PERFORMED
Amphetamines: NOT DETECTED
Barbiturates: NOT DETECTED
Benzodiazepines: NOT DETECTED
Cocaine: NOT DETECTED
Opiates: NOT DETECTED
Tetrahydrocannabinol: POSITIVE — AB

## 2023-08-10 LAB — ETHANOL: Alcohol, Ethyl (B): <15 mg/dL (ref <15)

## 2023-08-10 MED ORDER — HYDROXYZINE HCL 25 MG PO TABS
25.0000 mg | ORAL_TABLET | Freq: Four times a day (QID) | ORAL | 0 refills | Status: DC
Start: 2023-08-10 — End: 2023-08-29

## 2023-08-10 MED ORDER — HYDROXYZINE HCL 25 MG PO TABS
25.0000 mg | ORAL_TABLET | Freq: Once | ORAL | Status: AC
  Administered 2023-08-10: 25 mg via ORAL
  Filled 2023-08-10: qty 1

## 2023-08-10 NOTE — ED Triage Notes (Signed)
 Patient walk out before getting his VS done and start the triage process. Patient stated I'm gonna go to a different hospital I dont like the energy. Patient left the department.

## 2023-08-10 NOTE — ED Provider Notes (Signed)
 [place] EMERGENCY DEPARTMENT AT [facility] Provider Note   CSN: [id number] Arrival date & time: 08/10/23  9461     Patient presents with: Anxiety   Zachary Stewart is a 33 y.o. male.   Patient with no pertinent past medical history presents today with complaints of anxiety. Reports that he has PTSD and anxiety and is not on any medications for this. Reports that he tried to see a therapist but missed his appointment. He has another appointment in August. Reports he was here at the beginning of June for similar complaints and was prescribed atarax  which helped until he ran out. He has been using marijuana for his symptoms. Denies SI/HI or AVH. No fevers, chills, chest pain, shortness of breath, nausea, vomiting, diarrhea, or abdominal pain.  The history is provided by the patient. No language interpreter was used.  Anxiety       Prior to Admission medications   Medication Sig Start Date End Date Taking? Authorizing Provider  hydrOXYzine  (ATARAX ) 25 MG tablet Take 1 tablet (25 mg total) by mouth every 8 (eight) hours as needed for anxiety. 06/17/23   Suzette Pac, MD    Allergies: Patient has no known allergies.    Review of Systems  All other systems reviewed and are negative.   Updated Vital Signs BP (!) 139/96 (BP Location: Right Arm)   Pulse (!) 104   Temp 98.7 F (37.1 C) (Oral)   Resp 18   SpO2 99%   Physical Exam Vitals and nursing note reviewed.  Constitutional:      General: He is not in acute distress.    Appearance: Normal appearance. He is normal weight. He is not ill-appearing, toxic-appearing or diaphoretic.  HENT:     Head: Normocephalic and atraumatic.  Cardiovascular:     Rate and Rhythm: Normal rate.  Pulmonary:     Effort: Pulmonary effort is normal. No respiratory distress.  Musculoskeletal:        General: Normal range of motion.     Cervical back: Normal range of motion.  Skin:    General: Skin is warm and dry.  Neurological:      General: No focal deficit present.     Mental Status: He is alert.  Psychiatric:        Mood and Affect: Mood normal.        Behavior: Behavior normal.     (all labs ordered are listed, but only abnormal results are displayed) Labs Reviewed  COMPREHENSIVE METABOLIC PANEL WITH GFR - Abnormal; Notable for the following components:      Result Value   Glucose, Bld 127 (*)    Total Protein 5.8 (*)    Alkaline Phosphatase 37 (*)    Total Bilirubin 1.3 (*)    All other components within normal limits  RAPID URINE DRUG SCREEN, HOSP PERFORMED - Abnormal; Notable for the following components:   Tetrahydrocannabinol POSITIVE (*)    All other components within normal limits  ETHANOL  CBC    EKG: None  Radiology: No results found.   Procedures   Medications Ordered in the ED  hydrOXYzine  (ATARAX ) tablet 25 mg (has no administration in time range)                                    Medical Decision Making Risk Prescription drug management.   Patient presents with anxiety. He is afebrile, non-toxic appearing, and  in no acute distress with reassuring vital signs. Laboratory evaluation ordered and obtained in triage prior to my evaluation and shows UDS + THC. No other acute laboratory abnormalities. Chart reviewed, was here for same on 6/01, discharged with atarax . Reports this helped his symptoms. Will provide refill of this medication as well as outpatient resources. Given information for [facility] as well. No SI/HI or AVH, no indication for ER TTS eval. Discussed same with patient who is understanding. Evaluation and diagnostic testing in the emergency department does not suggest an emergent condition requiring admission or immediate intervention beyond what has been performed at this time.  Plan for discharge with close PCP follow-up.  Patient is understanding and amenable with plan, educated on red flag symptoms that would prompt immediate return.  Patient discharged in stable  condition.  Final diagnoses:  Anxiety    ED Discharge Orders          Ordered    hydrOXYzine  (ATARAX ) 25 MG tablet  Every 6 hours        08/10/23 0702          An After Visit Summary was printed and given to the patient.      Retha Bither A, PA-C 08/10/23 9295    Theadore Ozell HERO, MD 08/24/23 [phone]

## 2023-08-10 NOTE — ED Notes (Signed)
 At DC pt was up attempting to engage the [employer] staff in conversation. He appeared manic in his behavior. Pt began to challenge this nurse verbally by stating that I was talking to him like I'm stupid. Security was called to escort pt out of the department. Pt was verbally abusive to staff continuously as security escorted him outside the sterile area of the ED.

## 2023-08-10 NOTE — ED Notes (Addendum)
 Pt was roomed to hallway bed and began to argue with this tech about being placed in the hallway. This tech let the patient know that the charge nurse placed him there based on his acuity/complaint. The patient demanded to speak to charge nurse. Charge nurse notified and explained to patient her reasoning for placement. Pt began to get agitated but stayed in assignment at this time.

## 2023-08-10 NOTE — ED Triage Notes (Signed)
 Patient requesting psychiatric evaluation for his anxiety/panic attack this morning , agitated at arrival with flight of ideas . Denies SI or HI . No hallucinations .

## 2023-08-10 NOTE — Discharge Instructions (Signed)
 As we discussed, your workup in the ER today was reassuring for acute findings.  Your evaluation did not reveal any emergent cause of your symptoms.  I have refilled your prescription for Atarax  which she reported helped previously.  Take this as prescribed as needed.  I have also given you information with the behavioral health urgent care where you can go for continued evaluation and management of the symptoms.  I have attached resources to this packet for you as well.  Return if development of any new or worsening symptoms.

## 2023-08-29 ENCOUNTER — Encounter: Payer: Self-pay | Admitting: Nurse Practitioner

## 2023-08-29 ENCOUNTER — Ambulatory Visit (INDEPENDENT_AMBULATORY_CARE_PROVIDER_SITE_OTHER): Admitting: Nurse Practitioner

## 2023-08-29 VITALS — BP 128/91 | HR 74 | Temp 97.8°F | Wt 185.8 lb

## 2023-08-29 DIAGNOSIS — F419 Anxiety disorder, unspecified: Secondary | ICD-10-CM | POA: Diagnosis not present

## 2023-08-29 DIAGNOSIS — R030 Elevated blood-pressure reading, without diagnosis of hypertension: Secondary | ICD-10-CM | POA: Diagnosis not present

## 2023-08-29 MED ORDER — HYDROXYZINE HCL 25 MG PO TABS
25.0000 mg | ORAL_TABLET | Freq: Four times a day (QID) | ORAL | 1 refills | Status: DC
Start: 2023-08-29 — End: 2023-08-29

## 2023-08-29 MED ORDER — HYDROXYZINE HCL 25 MG PO TABS: 25.0000 mg | ORAL_TABLET | Freq: Four times a day (QID) | ORAL | 1 refills | Status: AC | PRN

## 2023-08-29 NOTE — Assessment & Plan Note (Addendum)
 1. Anxiety (Primary)  - Ambulatory referral to Psychiatry - hydrOXYzine  (ATARAX ) 25 MG tablet; Take 1 tablet (25 mg total) by mouth every 6 (six) hours as needed.  Dispense: 60 tablet; Refill: 1  - Ambulatory referral to Psychiatry  Information for [facility] behavioral health care center provided, advised the patient to seek urgent care if needed

## 2023-08-29 NOTE — Assessment & Plan Note (Addendum)
 BP Readings from Last 3 Encounters:  08/29/23 (!) 128/91  08/10/23 (!) 141/96  06/17/23 114/87    Patient was talking when blood pressure was being checked, told to stop talking but he would not so this could have caused the elevation Will reevaluate at next visit

## 2023-08-29 NOTE — Progress Notes (Signed)
 New Patient Office Visit  Subjective:  Patient ID: Zachary Stewart, male    DOB: 1990-04-12  Age: 33 y.o. MRN: [id number]  CC:  Chief Complaint  Patient presents with   Establish Care    HPI Zachary Stewart is a 33 y.o. male  has a past medical history of Anxiety and Hernia, inguinal, right.  Patient presents to establish pain he has no previous PCP   His main concern today is his  anxiety, and needs refill for the medication.  He was taking hydroxyzine  25 mg every 6 hours  for anxiety but has been out of the medication. he attributes his anxiety to his past experiences he is interested in seeing a therapist.  He has been smoking marijuana to manage anxiety.  He denied use of any drugs denies SI, HI   History of Present Illness      Past Medical History:  Diagnosis Date   Anxiety    Hernia, inguinal, right     Past Surgical History:  Procedure Laterality Date   HERNIA REPAIR     INGUINAL HERNIA REPAIR N/A 10/02/2017   Procedure: LAPAROSCOPIC RIGHT INGUINAL HERNIA REPAIR WITH MESH;  Surgeon: Zachary Calamity, MD;  Location: [facility] OR;  Service: General;  Laterality: N/A;   INSERTION OF MESH N/A 10/02/2017   Procedure: INSERTION OF MESH;  Surgeon: Zachary Calamity, MD;  Location: [facility] OR;  Service: General;  Laterality: N/A;   NO PAST SURGERIES      Family History  Problem Relation Age of Onset   Anemia Mother    Hypertension Father     Social History   Socioeconomic History   Marital status: Single    Spouse name: Not on file   Number of children: 4   Years of education: Not on file   Highest education level: GED or equivalent  Occupational History   Not on file  Tobacco Use   Smoking status: Former   Smokeless tobacco: Never  Vaping Use   Vaping status: Never Used  Substance and Sexual Activity   Alcohol use: Yes    Comment: rarely   Drug use: Yes    Types: Marijuana   Sexual activity: Yes    Birth control/protection: None, Condom  Other Topics Concern   Not on  file  Social History Narrative   Lives with a friend    Social Drivers of Corporate investment banker Strain: Not on file  Food Insecurity: Not on file  Transportation Needs: Unmet Transportation Needs (06/18/2023)   PRAPARE - Transportation    Lack of Transportation (Medical): Yes    Lack of Transportation (Non-Medical): Yes  Physical Activity: Insufficiently Active (06/18/2023)   Exercise Vital Sign    Days of Exercise per Week: 2 days    Minutes of Exercise per Session: 30 min  Stress: Stress Concern Present (06/18/2023)   [employer] of Occupational Health - Occupational Stress Questionnaire    Feeling of Stress : Very much  Social Connections: Unknown (06/18/2023)   Social Connection and Isolation Panel    Frequency of Communication with Friends and Family: More than three times a week    Frequency of Social Gatherings with Friends and Family: Twice a week    Attends Religious Services: 1 to 4 times per year    Active Member of [employer] or Organizations: No    Attends Banker Meetings: Not on file    Marital Status: Patient declined  Intimate Partner Violence: Not At Risk (08/27/2023)  Received from [facility]   HITS    Over the last 12 months how often did your partner physically hurt you?: Never    Over the last 12 months how often did your partner insult you or talk down to you?: Never    Over the last 12 months how often did your partner threaten you with physical harm?: Never    Over the last 12 months how often did your partner scream or curse at you?: Never    ROS Review of Systems  Constitutional:  Negative for appetite change, chills, fatigue and fever.  HENT:  Negative for congestion, postnasal drip, rhinorrhea and sneezing.   Respiratory:  Negative for cough, shortness of breath and wheezing.   Cardiovascular:  Negative for chest pain, palpitations and leg swelling.  Gastrointestinal:  Negative for abdominal pain, constipation, nausea and vomiting.   Genitourinary:  Negative for difficulty urinating, dysuria, flank pain and frequency.  Musculoskeletal:  Negative for arthralgias, back pain, joint swelling and myalgias.  Skin:  Negative for color change, pallor, rash and wound.  Neurological:  Negative for dizziness, facial asymmetry, weakness, numbness and headaches.  Psychiatric/Behavioral:  Negative for behavioral problems, confusion, self-injury and suicidal ideas.     Objective:   Today's Vitals: BP (!) 128/91   Pulse 74   Temp 97.8 F (36.6 C) (Oral)   Wt 185 lb 12.8 oz (84.3 kg)   SpO2 99%   BMI 25.20 kg/m   Physical Exam Vitals and nursing note reviewed.  Constitutional:      General: He is not in acute distress.    Appearance: Normal appearance. He is not ill-appearing, toxic-appearing or diaphoretic.  Eyes:     General: No scleral icterus.       Right eye: No discharge.        Left eye: No discharge.     Extraocular Movements: Extraocular movements intact.     Conjunctiva/sclera: Conjunctivae normal.  Cardiovascular:     Rate and Rhythm: Normal rate and regular rhythm.     Pulses: Normal pulses.     Heart sounds: Normal heart sounds. No murmur heard.    No friction rub. No gallop.  Pulmonary:     Effort: Pulmonary effort is normal. No respiratory distress.     Breath sounds: Normal breath sounds. No stridor. No wheezing, rhonchi or rales.  Chest:     Chest wall: No tenderness.  Abdominal:     General: There is no distension.     Palpations: Abdomen is soft.     Tenderness: There is no abdominal tenderness. There is no right CVA tenderness, left CVA tenderness or guarding.  Musculoskeletal:        General: No swelling, tenderness, deformity or signs of injury.     Right lower leg: No edema.     Left lower leg: No edema.  Skin:    General: Skin is warm and dry.     Capillary Refill: Capillary refill takes less than 2 seconds.     Coloration: Skin is not jaundiced or pale.     Findings: No bruising,  erythema or lesion.  Neurological:     Mental Status: He is alert and oriented to person, place, and time.     Motor: No weakness.     Coordination: Coordination normal.     Gait: Gait normal.  Psychiatric:        Mood and Affect: Mood normal.        Thought Content: Thought content normal.  Judgment: Judgment normal.     Assessment & Plan:   Problem List Items Addressed This Visit       Other   Anxiety - Primary   1. Anxiety (Primary)  - Ambulatory referral to Psychiatry - hydrOXYzine  (ATARAX ) 25 MG tablet; Take 1 tablet (25 mg total) by mouth every 6 (six) hours as needed.  Dispense: 60 tablet; Refill: 1  - Ambulatory referral to Psychiatry  Information for [facility] behavioral [facility] care center provided, advised the patient to seek urgent care if needed      Relevant Medications   hydrOXYzine  (ATARAX ) 25 MG tablet   Other Relevant Orders   Ambulatory referral to Psychiatry   Elevated BP without diagnosis of hypertension   BP Readings from Last 3 Encounters:  08/29/23 (!) 128/91  08/10/23 (!) 141/96  06/17/23 114/87    Patient was talking when blood pressure was being checked, told to stop talking but he would not so this could have caused the elevation Will reevaluate at next visit       Outpatient Encounter Medications as of 08/29/2023  Medication Sig   hydrOXYzine  (ATARAX ) 25 MG tablet Take 1 tablet (25 mg total) by mouth every 6 (six) hours as needed.   [DISCONTINUED] hydrOXYzine  (ATARAX ) 25 MG tablet Take 1 tablet (25 mg total) by mouth every 6 (six) hours. (Patient not taking: Reported on 08/29/2023)   [DISCONTINUED] hydrOXYzine  (ATARAX ) 25 MG tablet Take 1 tablet (25 mg total) by mouth every 6 (six) hours.   No facility-administered encounter medications on file as of 08/29/2023.    Follow-up: Return in about 4 weeks (around 09/26/2023) for ANXIETY.   Bobbyjo Marulanda R Tahara Ruffini, FNP

## 2023-08-29 NOTE — Patient Instructions (Signed)
 1. Anxiety (Primary)  - hydrOXYzine  (ATARAX ) 25 MG tablet; Take 1 tablet (25 mg total) by mouth every 6 (six) hours.  Dispense: 60 tablet; Refill: 1 - Ambulatory referral to Psychiatry    It is important that you exercise regularly at least 30 minutes 5 times a week as tolerated  Think about what you will eat, plan ahead. Choose  clean, green, fresh or frozen over canned, processed or packaged foods which are more sugary, salty and fatty. 70 to 75% of food eaten should be vegetables and fruit. Three meals at set times with snacks allowed between meals, but they must be fruit or vegetables. Aim to eat over a 12 hour period , example 7 am to 7 pm, and STOP after  your last meal of the day. Drink water,generally about 64 ounces per day, no other drink is as healthy. Fruit juice is best enjoyed in a healthy way, by EATING the fruit.  Thanks for choosing [facility] we consider it a privelige to serve you.

## 2023-09-15 NOTE — Progress Notes (Signed)
   09/15/23 1003  Medical SW Discharge Summary  Assessment Type Discharge Planning Summary  Discharge Date 09/15/23  DC Location Address [address] [place], [place]  Services Provided and Referrals  Transportation  Yes  Transport Method [employer]  Other Services Yes  Other Services Other  Pt./Family Notified and Agree w/Plan Yes  Comments MSS consulted for d/c and transportation. MSS met with pt, who was mildly irritable with pressured speech, sharing personal history and discord with family. MSS provided supportive listening. Pt reported he did not know where his vehicle was taken by LE and needs to get to it so he can continue with his life. Pt reported being from [place]. Pt reported he was frustrated that LE had him come to the ED via EMS. Pt reported driving a [id number] black Jeep Cherokee. Pt did not have information provided by LE. MSS attempted to confirm information by reaching out to [facility], [facility], and [employer]. Ultimately, paperwork was found and pt's car located at [facility] [address] [place], [place]. MSS arranged ride via [facility] at 1139 and updated GS. At 1151, [facility] informed MSS pt was escorted off property due to behavior. MSS canceled ride with [facility].

## 2023-09-15 NOTE — ED Provider Notes (Signed)
 History   Chief Complaint  Patient presents with  . Anxiety    Wanted to talk to social work, pt denies SI/HI multiple times, pt does not present suicidal just aggravated with family situation    Zachary Stewart is a 33 y.o. male no known medical history presenting for transportation assistance.  Apparently patient was pulled over by [employer] and his car was impounded.  He was brought to the ER via ambulance.  He states that he just needs to get his car and continue on with his life.  He is presenting for assistance to get to where his car is.   History provided by:  Patient   No past medical history on file.  No past surgical history on file.  No family history on file.  Social History   Social History Narrative  . Not on file    Social History   Tobacco Use  . Smoking status: Never  . Smokeless tobacco: Never    Review of Systems  Psychiatric/Behavioral:  Positive for agitation.       Physical Exam  Initial ED Vital Signs: Blood Pressure: 126/81  Heart Rate: 69  Temp: 98 F (36.7 C) Resp: 18 SpO2: 97 %    Last Documented Vital Signs This Encounter: Blood Pressure: 126/81   Heart Rate: 69 Temp: 98 F (36.7 C)  Resp: 18  SpO2: 97 % Height: 5' 11 (180.3 cm) Weight: 79.4 kg (175 lb)   height is 5' 11 (1.803 m) and weight is 79.4 kg (175 lb). His temperature is 98 F (36.7 C). His blood pressure is 126/81 and his pulse is 69. His respiration is 18 and oxygen saturation is 97%.    Physical Exam Vitals and nursing note reviewed.  Constitutional:      General: He is not in acute distress.    Appearance: He is well-developed. He is not diaphoretic.  HENT:     Head: Normocephalic and atraumatic.     Nose: Nose normal.   Eyes:     Pupils: Pupils are equal, round, and reactive to light.   Pulmonary:     Effort: Pulmonary effort is normal.   Musculoskeletal:        General: No deformity. Normal range of motion.     Cervical back: Normal  range of motion and neck supple.   Skin:    General: Skin is warm and dry.   Neurological:     Mental Status: He is alert and oriented to person, place, and time.     Cranial Nerves: No cranial nerve deficit.   Psychiatric:        Mood and Affect: Mood is anxious. Affect is angry.        Speech: Speech is rapid and pressured.        Behavior: Behavior is agitated.                     ED Course   Orders Placed This Encounter  . Consult to Social Work - Architectural technologist    Procedures  No orders to [employer] - No data to display    ED Discharge Orders (From admission, onward)    None        ED Prescriptions   None     Diagnosis:   Final diagnoses:  Assistance needed with transportation (Primary)     Follow Up Instructions:  [facility] Emergency Department [address] [place]  [address]  [phone]  As needed    Disposition: Discharge Condition:  Stable         [facility] ED MDM 2023 :   MDM Narrative:      Patient here with agitation needing transportation back to his vehicle.  I explained multiple times that this facility has nothing to do with his prehospital care.  He is alert and oriented x 4 denies SI HI and though he is quite agitated and frankly inappropriate with his verbiage does not represent a danger to himself or others.  Explained that we will have the social worker see him to help with transportation assistance but that if he decides to leave at any time he may walk out the door as we are not able to keep him here against as well.  Will speak with social work and discharge.  No workup indicated today                            Kranc, Alm Blunt- was the Primary ED Provider for this patient.  The above dictation was dictated with [facility] [employer]    Charlaine Alm Blunt, MD 09/15/23 1005

## 2023-09-15 NOTE — ED Notes (Signed)
 Pt using loud tones and profanity outside of room 91 and is uncooperative. PSO called and to bedside.

## 2023-09-21 ENCOUNTER — Emergency Department (HOSPITAL_COMMUNITY)
Admission: EM | Admit: 2023-09-21 | Discharge: 2023-09-21 | Discharge status: Against Medical Advice | Attending: Emergency Medicine | Admitting: Emergency Medicine

## 2023-09-21 ENCOUNTER — Other Ambulatory Visit: Payer: Self-pay

## 2023-09-21 DIAGNOSIS — F419 Anxiety disorder, unspecified: Secondary | ICD-10-CM | POA: Insufficient documentation

## 2023-09-21 DIAGNOSIS — Z5329 Procedure and treatment not carried out because of patient's decision for other reasons: Secondary | ICD-10-CM | POA: Insufficient documentation

## 2023-09-21 NOTE — ED Triage Notes (Signed)
 Pt to triage, states he wants to be checked out, does not answer questions appropriately, pt speaking loudly regarding race, religion, and other subjects not pertaining to visit. Pt has an empty bottle of hydroxyzine  that was filled last week. Pt states he threw out the pills. Pt denies SI/HI, states Jesus talks to him and he can see Jesus.

## 2023-09-21 NOTE — ED Triage Notes (Signed)
 Pt having flight of ideas. Pt having religiosity.

## 2023-09-21 NOTE — ED Notes (Addendum)
 Pt becoming verbally aggressive with staff and requesting to leave. Pt encouraged to stay to receive full treatment plan but pt refused. Pt seen ambulating out of the department while yelling vulgar and inappropriate things. Provider made aware.

## 2023-09-21 NOTE — ED Notes (Signed)
 Pt refusing to change into paper scrubs and refusing blood work in triage.

## 2023-09-21 NOTE — BH Assessment (Signed)
 Attempted to complete TTS assessment, NT advised that patient was finally asleep and requested if he could be seen at a later time. Advised Nursing staff to reach out via secure chat when the patient awakes to complete the assessment.

## 2023-09-21 NOTE — ED Provider Notes (Signed)
  [facility] Emergency Department Provider Note MRN:  [id number]  Arrival date & time: 09/21/23     Chief Complaint   Mental Health Problem and Psychiatric Evaluation   History of Present Illness   Zachary Stewart is a 33 y.o. year-old male presents to the Zachary with chief complaint of hyper religiosity.  He states that he can see Jesus and talks with Jesus frequently.  He states that he needs help getting back to hello for you to be with his son.  He reports that he has severe anxiety and that his anxiety medication is not working.  He states that he does not trust women because they use men, and particularly does not trust women health care because they sleep around.  History provided by patient.   Review of Systems  Pertinent positive and negative review of systems noted in HPI.    Physical Exam   Vitals:   09/21/23 0159  BP: (!) 120/98  Pulse: 66  Resp: (!) 22  Temp: 98.2 F (36.8 C)  SpO2: 99%    CONSTITUTIONAL:  non toxic-appearing, NAD NEURO:  Alert and oriented x 3, CN 3-12 grossly intact EYES:  eyes equal and reactive ENT/NECK:  Supple, no stridor  CARDIO:  normal rate, regular rhythm, appears well-perfused  PULM:  No respiratory distress, CTAB GI/GU:  non-distended,  MSK/SPINE:  No gross deformities, no edema, moves all extremities  SKIN: no rash, atraumatic PSYCH:  flight of ideas   *Additional and/or pertinent findings included in MDM below  Diagnostic and Interventional Summary    EKG Interpretation Date/Time:    Ventricular Rate:    PR Interval:    QRS Duration:    QT Interval:    QTC Calculation:   R Axis:      Text Interpretation:         Labs Reviewed  COMPREHENSIVE METABOLIC PANEL WITH GFR  ETHANOL  CBC  RAPID URINE DRUG SCREEN, HOSP PERFORMED    No orders to display    Medications - No data to display   Procedures  /  Critical Care Procedures  Zachary Course and Medical Decision Making  I have reviewed the  triage vital signs, the nursing notes, and pertinent available records from the EMR.  Social Determinants Affecting Complexity of Care: Patient has no clinically significant social determinants affecting this chief complaint..   Zachary Course:    Medical Decision Making Amount and/or Complexity of Data Reviewed Labs: ordered.         Consultants: TTS consult pending.   Treatment and Plan: 6:13 AM Patient eloped after waking up.  I was not able to talk with the patient.  Patient denied SI or HI.  He did not seem a danger to himself or others.    Final Clinical Impressions(s) / Zachary Diagnoses     ICD-10-CM   1. Anxiety  F41.9       Zachary Discharge Orders     None         Discharge Instructions Discussed with and Provided to Patient:   Discharge Instructions   None      Vicky Charleston, PA-C 09/21/23 9386    Jerral Meth, MD 09/26/23 [phone]

## 2023-09-23 ENCOUNTER — Emergency Department (HOSPITAL_COMMUNITY)
Admission: EM | Admit: 2023-09-23 | Discharge: 2023-09-24 | Disposition: A | Discharge status: Home / Self Care | Source: Home / Self Care | Attending: Emergency Medicine | Admitting: Emergency Medicine

## 2023-09-23 ENCOUNTER — Emergency Department (HOSPITAL_COMMUNITY)
Admission: EM | Admit: 2023-09-23 | Discharge: 2023-09-23 | Discharge status: Against Medical Advice | Attending: Emergency Medicine | Admitting: Emergency Medicine

## 2023-09-23 ENCOUNTER — Encounter (HOSPITAL_COMMUNITY): Payer: Self-pay | Admitting: *Deleted

## 2023-09-23 ENCOUNTER — Other Ambulatory Visit: Payer: Self-pay

## 2023-09-23 DIAGNOSIS — J069 Acute upper respiratory infection, unspecified: Secondary | ICD-10-CM | POA: Diagnosis not present

## 2023-09-23 DIAGNOSIS — Z87891 Personal history of nicotine dependence: Secondary | ICD-10-CM | POA: Diagnosis not present

## 2023-09-23 DIAGNOSIS — Z5321 Procedure and treatment not carried out due to patient leaving prior to being seen by health care provider: Secondary | ICD-10-CM | POA: Insufficient documentation

## 2023-09-23 DIAGNOSIS — R059 Cough, unspecified: Secondary | ICD-10-CM | POA: Diagnosis present

## 2023-09-23 DIAGNOSIS — F419 Anxiety disorder, unspecified: Secondary | ICD-10-CM | POA: Insufficient documentation

## 2023-09-23 MED ORDER — ACETAMINOPHEN 325 MG PO TABS
650.0000 mg | ORAL_TABLET | Freq: Once | ORAL | Status: AC | PRN
  Administered 2023-09-23: 650 mg via ORAL

## 2023-09-23 MED ORDER — ACETAMINOPHEN 325 MG PO TABS
ORAL_TABLET | ORAL | Status: AC
  Filled 2023-09-23: qty 2

## 2023-09-23 NOTE — ED Notes (Signed)
 Pt visualized walking out of department with [employer] stating  I don't need any help.

## 2023-09-23 NOTE — ED Triage Notes (Signed)
 Pt here from home with c/o nasal congestion and cough

## 2023-09-24 MED ORDER — HYDROXYZINE HCL 10 MG PO TABS
10.0000 mg | ORAL_TABLET | Freq: Once | ORAL | Status: AC
  Administered 2023-09-24: 10 mg via ORAL
  Filled 2023-09-24: qty 1

## 2023-09-24 NOTE — ED Provider Notes (Signed)
 [place] EMERGENCY DEPARTMENT AT [facility] Provider Note   CSN: [id number] Arrival date & time: 09/23/23  2132     Patient presents with: No chief complaint on file.   Zachary Stewart is a 33 y.o. male.  Patient presents emergency room complaining of upper respiratory symptoms and also complaint of increased anxiety.  Patient with documented history of anxiety.  He states it seems to go with seasonal changes.  He states he will have increased anxiety for a period and then will resolve on its own.  He tells me that he was started on a anxiety medication when living in [place]  but that he reacted poorly to it and stopped taking it.  He no longer takes medication for anxiety.  Patient also complains of cough and congestion.  He denies sore throat, fever, nausea, vomiting, shortness of breath.   HPI     Prior to Admission medications   Medication Sig Start Date End Date Taking? Authorizing Provider  hydrOXYzine  (ATARAX ) 25 MG tablet Take 1 tablet (25 mg total) by mouth every 6 (six) hours as needed. 08/29/23   Paseda, Folashade R, FNP    Allergies: Patient has no known allergies.    Review of Systems  Updated Vital Signs BP (!) 133/92 (BP Location: Right Arm)   Pulse 61   Temp (!) 97.5 F (36.4 C)   Resp 16   Ht 6' (1.829 m)   Wt 85 kg   SpO2 97%   BMI 25.41 kg/m   Physical Exam Vitals and nursing note reviewed.  Constitutional:      General: He is not in acute distress.    Appearance: He is well-developed.  HENT:     Head: Normocephalic and atraumatic.     Nose: Congestion present. No rhinorrhea.     Mouth/Throat:     Pharynx: Oropharynx is clear. No oropharyngeal exudate or posterior oropharyngeal erythema.  Eyes:     Conjunctiva/sclera: Conjunctivae normal.  Cardiovascular:     Rate and Rhythm: Normal rate and regular rhythm.  Pulmonary:     Effort: Pulmonary effort is normal. No respiratory distress.     Breath sounds: Normal breath sounds.  Abdominal:      Palpations: Abdomen is soft.     Tenderness: There is no abdominal tenderness.  Musculoskeletal:        General: No swelling.     Cervical back: Neck supple.  Skin:    General: Skin is warm and dry.  Neurological:     Mental Status: He is alert.  Psychiatric:        Mood and Affect: Mood normal.     Comments: Flight of ideas, denies hallucinations, SI, HI     (all labs ordered are listed, but only abnormal results are displayed) Labs Reviewed  GROUP A STREP BY PCR    EKG: None  Radiology: No results found.   Procedures   Medications Ordered in the ED  acetaminophen  (TYLENOL ) tablet 650 mg (650 mg Oral Given 09/23/23 2243)  hydrOXYzine  (ATARAX ) tablet 10 mg (10 mg Oral Given 09/24/23 0317)                                    Medical Decision Making Risk Prescription drug management.   This patient presents to the ED for concern of upper respiratory symptoms, this involves an extensive number of treatment options, and is a complaint that carries  with it a high risk of complications and morbidity.  The differential diagnosis includes COVID, influenza, RSV, other viral infection, others.  Patient also complains of increased anxiety, consistent with previous anxiety attacks   Co morbidities / Chronic conditions that complicate the patient evaluation  Anxiety   Additional history obtained:  Additional history obtained from EMR   Problem List / ED Course / Critical interventions / Medication management   I ordered medication including Atarax , Tylenol  Reevaluation of the patient after these medicines showed that the patient improved   Social Determinants of Health:  Patient has Medicaid for his primary health insurance type. He is a former smoker.    Test / Admission - Considered:  Patient with symptoms consistent with viral respiratory infection. Lung sounds are clear to auscultation. He denies dyspnea. No indication for imaging.  Patient declines viral  testing and strep testing.  Patient advised on supportive care measures to be taken at home Patient endorsing increased anxiety.  He does have flight of ideas with speech but is able to express to me that he has no desire to hurt himself or others.  He denies any hallucinations.  He endorses a history of anxiety and feels that it fluctuates throughout the year.  He is currently not medicated for the same.  He has been previously treated with a dose of Atarax  with improvement in symptoms.  Patient treated with the same this evening with decreased anxiety.  Patient endorses following with her primary care team including therapy for assistance with his anxiety and has no interest in outpatient medications.  No indication for further emergent workup or admission.  Patient stable for discharge at this time.      Final diagnoses:  Upper respiratory tract infection, unspecified type  Anxiety    ED Discharge Orders     None          Logan Ubaldo KATHEE DEVONNA 09/24/23 0404    Mesner, Selinda, MD 09/24/23 [phone]

## 2023-09-24 NOTE — Discharge Instructions (Addendum)
 Your symptoms this evening are consistent with an upper respiratory infection.  This is likely viral in nature.  You may take over-the-counter medication such as Tylenol  or ibuprofen for symptom control.  You are also seen for anxiety.  This was treated with Atarax  (hydroxyzine ).  Please follow-up with your primary care provider and therapist for further management of your anxiety and as needed for further evaluation of your upper respiratory infection.  If you develop any life-threatening symptoms return to the emergency department.

## 2023-09-25 ENCOUNTER — Telehealth: Payer: Self-pay

## 2023-09-25 NOTE — Transitions of Care (Post Inpatient/ED Visit) (Signed)
   09/25/2023  Name: Maysen Sudol MRN: [id number] DOB: [birth year]  Today's TOC FU Call Status:   Patient's Name and Date of Birth confirmed.  Transition Care Management Follow-up Telephone Call Date of Discharge: 09/24/23 Discharge Facility: [facility]) Type of Discharge: Emergency Department How have you been since you were released from the hospital?: Better Any questions or concerns?: Yes  Items Reviewed: Did you receive and understand the discharge instructions provided?: No Medications obtained,verified, and reconciled?:  (Patient stated that he do not take any medication) Any new allergies since your discharge?: No Dietary orders reviewed?: NA Do you have support at home?: No  Medications Reviewed Today: Medications Reviewed Today   Medications were not reviewed in this encounter     Home Care and Equipment/Supplies:    Functional Questionnaire:    Follow up appointments reviewed:      [facility], RMA

## 2023-10-05 ENCOUNTER — Ambulatory Visit: Payer: Self-pay | Admitting: Nurse Practitioner

## 2023-10-25 ENCOUNTER — Other Ambulatory Visit: Payer: Self-pay

## 2023-10-25 ENCOUNTER — Emergency Department (HOSPITAL_COMMUNITY)
Admission: EM | Admit: 2023-10-25 | Discharge: 2023-10-25 | Discharge status: Against Medical Advice | Attending: Emergency Medicine | Admitting: Emergency Medicine

## 2023-10-25 DIAGNOSIS — M25561 Pain in right knee: Secondary | ICD-10-CM | POA: Insufficient documentation

## 2023-10-25 DIAGNOSIS — Z5321 Procedure and treatment not carried out due to patient leaving prior to being seen by health care provider: Secondary | ICD-10-CM | POA: Insufficient documentation

## 2023-10-25 DIAGNOSIS — R519 Headache, unspecified: Secondary | ICD-10-CM | POA: Diagnosis present

## 2023-10-25 NOTE — ED Triage Notes (Signed)
 The pt has had a headache and he  is having rt knee pain for several days he has also had an xray fo that knee

## 2023-10-25 NOTE — ED Notes (Signed)
 Pt called for vitals x2 with no answer

## 2023-10-26 ENCOUNTER — Emergency Department (HOSPITAL_COMMUNITY)
Admission: EM | Admit: 2023-10-26 | Discharge: 2023-10-26 | Disposition: A | Discharge status: Home / Self Care | Source: Home / Self Care | Attending: Emergency Medicine | Admitting: Emergency Medicine

## 2023-10-26 ENCOUNTER — Emergency Department (HOSPITAL_COMMUNITY)
Admission: EM | Admit: 2023-10-26 | Discharge: 2023-10-26 | Discharge status: Against Medical Advice | Attending: Emergency Medicine | Admitting: Emergency Medicine

## 2023-10-26 ENCOUNTER — Other Ambulatory Visit: Payer: Self-pay

## 2023-10-26 DIAGNOSIS — R109 Unspecified abdominal pain: Secondary | ICD-10-CM | POA: Insufficient documentation

## 2023-10-26 DIAGNOSIS — L509 Urticaria, unspecified: Secondary | ICD-10-CM | POA: Insufficient documentation

## 2023-10-26 DIAGNOSIS — Z139 Encounter for screening, unspecified: Secondary | ICD-10-CM | POA: Insufficient documentation

## 2023-10-26 DIAGNOSIS — Z5321 Procedure and treatment not carried out due to patient leaving prior to being seen by health care provider: Secondary | ICD-10-CM | POA: Insufficient documentation

## 2023-10-26 DIAGNOSIS — M25561 Pain in right knee: Secondary | ICD-10-CM | POA: Diagnosis present

## 2023-10-26 LAB — COMPREHENSIVE METABOLIC PANEL WITH GFR
ALT: 20 U/L (ref 0–44)
AST: 28 U/L (ref 15–41)
Albumin: 4 g/dL (ref 3.5–5.0)
Alkaline Phosphatase: 45 U/L (ref 38–126)
Anion gap: 11 (ref 5–15)
BUN: 11 mg/dL (ref 6–20)
CO2: 23 mmol/L (ref 22–32)
Calcium: 9.3 mg/dL (ref 8.9–10.3)
Chloride: 106 mmol/L (ref 98–111)
Creatinine, Ser: 1.06 mg/dL (ref 0.61–1.24)
GFR, Estimated: >60 mL/min (ref >60)
Glucose, Bld: 92 mg/dL (ref 70–99)
Potassium: 4.2 mmol/L (ref 3.5–5.1)
Sodium: 140 mmol/L (ref 135–145)
Total Bilirubin: 1.5 mg/dL — ABNORMAL HIGH (ref 0.0–1.2)
Total Protein: 6.4 g/dL — ABNORMAL LOW (ref 6.5–8.1)

## 2023-10-26 LAB — URINALYSIS, ROUTINE W REFLEX MICROSCOPIC
Bilirubin Urine: NEGATIVE
Glucose, UA: NEGATIVE mg/dL
Hgb urine dipstick: NEGATIVE
Ketones, ur: NEGATIVE mg/dL
Leukocytes,Ua: NEGATIVE
Nitrite: NEGATIVE
Protein, ur: NEGATIVE mg/dL
Specific Gravity, Urine: 1.017 (ref 1.005–1.030)
pH: 6 (ref 5.0–8.0)

## 2023-10-26 LAB — CBC
HCT: 45.5 % (ref 39.0–52.0)
Hemoglobin: 15.1 g/dL (ref 13.0–17.0)
MCH: 29.1 pg (ref 26.0–34.0)
MCHC: 33.2 g/dL (ref 30.0–36.0)
MCV: 87.7 fL (ref 80.0–100.0)
Platelets: 230 K/uL (ref 150–400)
RBC: 5.19 MIL/uL (ref 4.22–5.81)
RDW: 13.7 % (ref 11.5–15.5)
WBC: 9.7 K/uL (ref 4.0–10.5)
nRBC: 0 % (ref 0.0–0.2)

## 2023-10-26 LAB — RAPID URINE DRUG SCREEN, HOSP PERFORMED
Amphetamines: NOT DETECTED
Barbiturates: NOT DETECTED
Benzodiazepines: NOT DETECTED
Cocaine: NOT DETECTED
Opiates: NOT DETECTED
Tetrahydrocannabinol: POSITIVE — AB

## 2023-10-26 LAB — ETHANOL: Alcohol, Ethyl (B): <15 mg/dL (ref <15)

## 2023-10-26 LAB — LIPASE, BLOOD: Lipase: 33 U/L (ref 11–51)

## 2023-10-26 NOTE — ED Triage Notes (Signed)
 Patient c/o flu like symptoms and right knee pain x 1 day.

## 2023-10-26 NOTE — ED Provider Notes (Signed)
 [place] EMERGENCY DEPARTMENT AT [facility] Provider Note   CSN: [id number] Arrival date & time: 10/26/23  0301     Patient presents with: Abdominal Pain, Knee Pain, and Psychiatric Evaluation   Zachary Stewart is a 33 y.o. male.   The history is provided by the patient and medical records.  Abdominal Pain Knee Pain  33 year old male with history of anxiety, presenting to the ED with various complaints.  Reports abdominal pain and knee pain to triage nurse, however tells me that [place] is [place].  Does me that he was living in [place]  for about 10 years but got to some trouble and had to come back down to [place]  for a court case regarding custody of his children.  This has been causing him some legal issues.  He states he is hopeful to go back to [place] or [place]  where his mother is.  Begins showing me multiple cards in his wallet with religious expressions and speaking heavily about God/Jesus.  Denies SI/HI.  States he is eating/drinking fine.    Prior to Admission medications   Medication Sig Start Date End Date Taking? Authorizing Provider  hydrOXYzine  (ATARAX ) 25 MG tablet Take 1 tablet (25 mg total) by mouth every 6 (six) hours as needed. 08/29/23   Paseda, Folashade R, FNP    Allergies: Patient has no known allergies.    Review of Systems  Gastrointestinal:  Positive for abdominal pain.  All other systems reviewed and are negative.   Updated Vital Signs BP 113/88 (BP Location: Right Arm)   Pulse 64   Temp 98.1 F (36.7 C)   Resp 16   SpO2 100%   Physical Exam Vitals and nursing note reviewed.  Constitutional:      Appearance: He is well-developed.  HENT:     Head: Normocephalic and atraumatic.  Eyes:     Conjunctiva/sclera: Conjunctivae normal.     Pupils: Pupils are equal, round, and reactive to light.  Cardiovascular:     Rate and Rhythm: Normal rate and regular rhythm.     Heart sounds: Normal heart  sounds.  Pulmonary:     Effort: Pulmonary effort is normal.     Breath sounds: Normal breath sounds.  Abdominal:     General: Bowel sounds are normal.     Palpations: Abdomen is soft.     Tenderness: There is no abdominal tenderness. There is no guarding or rebound.  Musculoskeletal:        General: Normal range of motion.     Cervical back: Normal range of motion.  Skin:    General: Skin is warm and dry.  Neurological:     Mental Status: He is alert and oriented to person, place, and time.  Psychiatric:     Comments: Some hyperreligious thoughts, no SI/HI voiced     (all labs ordered are listed, but only abnormal results are displayed) Labs Reviewed  COMPREHENSIVE METABOLIC PANEL WITH GFR - Abnormal; Notable for the following components:      Result Value   Total Protein 6.4 (*)    Total Bilirubin 1.5 (*)    All other components within normal limits  RAPID URINE DRUG SCREEN, HOSP PERFORMED - Abnormal; Notable for the following components:   Tetrahydrocannabinol POSITIVE (*)    All other components within normal limits  CBC  URINALYSIS, ROUTINE W REFLEX MICROSCOPIC  ETHANOL  LIPASE, BLOOD    EKG: None  Radiology: No  results found.   Procedures   Medications Ordered in the ED - No data to display                                  Medical Decision Making Amount and/or Complexity of Data Reviewed Labs: ordered. ECG/medicine tests: ordered and independent interpretation performed.   33 year old male presenting to the ED with various complaints.  Majority of our conversation was regarding his dissatisfaction with what he has interpreted as racism in the [place].  Clearly having some hyperreligiosity which has been noted on prior notes as well.  No SI/HI voiced.  He has stable labs and vitals today.  Do not feel he needs admission or emergent psychiatric commitment at this time.  Stable for discharge.  Final diagnoses:  Encounter for medical screening examination     ED Discharge Orders     None          Jarold Olam HERO, PA-C 10/26/23 0452    Griselda Norris, MD 10/26/23 [phone]

## 2023-10-26 NOTE — ED Triage Notes (Addendum)
 Pt presents with abd pain and right knee pain, pt talking non-sense that makes no sense, flight of ideas

## 2023-10-26 NOTE — ED Notes (Signed)
 Patient verbally abusive to staff. Patient stated I fuck this bitch tonight .

## 2023-11-01 ENCOUNTER — Other Ambulatory Visit: Payer: Self-pay

## 2023-11-01 ENCOUNTER — Encounter (HOSPITAL_BASED_OUTPATIENT_CLINIC_OR_DEPARTMENT_OTHER): Payer: Self-pay | Admitting: Emergency Medicine

## 2023-11-01 ENCOUNTER — Emergency Department (HOSPITAL_BASED_OUTPATIENT_CLINIC_OR_DEPARTMENT_OTHER)
Admission: EM | Admit: 2023-11-01 | Discharge: 2023-11-01 | Disposition: A | Discharge status: Home / Self Care | Attending: Emergency Medicine | Admitting: Emergency Medicine

## 2023-11-01 DIAGNOSIS — R1030 Lower abdominal pain, unspecified: Secondary | ICD-10-CM | POA: Diagnosis present

## 2023-11-01 DIAGNOSIS — K529 Noninfective gastroenteritis and colitis, unspecified: Secondary | ICD-10-CM | POA: Insufficient documentation

## 2023-11-01 LAB — CBC
HCT: 47.3 % (ref 39.0–52.0)
Hemoglobin: 16 g/dL (ref 13.0–17.0)
MCH: 29.3 pg (ref 26.0–34.0)
MCHC: 33.8 g/dL (ref 30.0–36.0)
MCV: 86.6 fL (ref 80.0–100.0)
Platelets: 197 K/uL (ref 150–400)
RBC: 5.46 MIL/uL (ref 4.22–5.81)
RDW: 13.5 % (ref 11.5–15.5)
WBC: 10.4 K/uL (ref 4.0–10.5)
nRBC: 0 % (ref 0.0–0.2)

## 2023-11-01 LAB — COMPREHENSIVE METABOLIC PANEL WITH GFR
ALT: 16 U/L (ref 0–44)
AST: 22 U/L (ref 15–41)
Albumin: 4.2 g/dL (ref 3.5–5.0)
Alkaline Phosphatase: 48 U/L (ref 38–126)
Anion gap: 9 (ref 5–15)
BUN: 11 mg/dL (ref 6–20)
CO2: 25 mmol/L (ref 22–32)
Calcium: 9.2 mg/dL (ref 8.9–10.3)
Chloride: 105 mmol/L (ref 98–111)
Creatinine, Ser: 1.13 mg/dL (ref 0.61–1.24)
GFR, Estimated: >60 mL/min (ref >60)
Glucose, Bld: 88 mg/dL (ref 70–99)
Potassium: 4.3 mmol/L (ref 3.5–5.1)
Sodium: 140 mmol/L (ref 135–145)
Total Bilirubin: 0.7 mg/dL (ref 0.0–1.2)
Total Protein: 6 g/dL — ABNORMAL LOW (ref 6.5–8.1)

## 2023-11-01 LAB — LIPASE, BLOOD: Lipase: 30 U/L (ref 11–51)

## 2023-11-01 NOTE — ED Triage Notes (Signed)
 Patient reports lower abdominal pain associated with nausea, vomiting, and diarrhea.

## 2023-11-01 NOTE — ED Notes (Addendum)
 Pt became verbally aggressive w/ RN related to being discharged, saying that he receives better care in [place]. Pt was argumentative w/ RN while reviewing d/c papers.

## 2023-11-01 NOTE — Discharge Instructions (Signed)
 Drink plenty of fluids.  Follow-up with primary doctor if not improving in the next few days.

## 2023-11-01 NOTE — ED Provider Notes (Signed)
 [place] EMERGENCY DEPARTMENT AT [facility] Provider Note   CSN: [id number] Arrival date & time: 11/01/23  0004     Patient presents with: Abdominal Pain   Zachary Stewart is a 33 y.o. male.   Patient is a 33 year old male with past medical history of anxiety and prior hernia repair.  Patient presenting today with complaints of abdominal cramping, nausea, and loose stools.  This has been ongoing for the past 2 days.  No fevers or chills.  No bloody stool.  No aggravating or alleviating factors.  He is tolerating oral intake without difficulty.       Prior to Admission medications   Medication Sig Start Date End Date Taking? Authorizing Provider  hydrOXYzine  (ATARAX ) 25 MG tablet Take 1 tablet (25 mg total) by mouth every 6 (six) hours as needed. 08/29/23   Paseda, Folashade R, FNP    Allergies: Patient has no known allergies.    Review of Systems  All other systems reviewed and are negative.   Updated Vital Signs BP (!) 129/93 (BP Location: Right Arm)   Pulse 89   Temp 98 F (36.7 C)   Resp 19   Ht 6' (1.829 m)   Wt 85 kg   SpO2 100%   BMI 25.41 kg/m   Physical Exam Vitals and nursing note reviewed.  Constitutional:      General: He is not in acute distress.    Appearance: He is well-developed. He is not diaphoretic.  HENT:     Head: Normocephalic and atraumatic.  Cardiovascular:     Rate and Rhythm: Normal rate and regular rhythm.     Heart sounds: No murmur heard.    No friction rub.  Pulmonary:     Effort: Pulmonary effort is normal. No respiratory distress.     Breath sounds: Normal breath sounds. No wheezing or rales.  Abdominal:     General: Bowel sounds are normal. There is no distension.     Palpations: Abdomen is soft.     Tenderness: There is no abdominal tenderness.  Musculoskeletal:        General: Normal range of motion.     Cervical back: Normal range of motion and neck supple.  Skin:    General: Skin is warm and dry.   Neurological:     Mental Status: He is alert and oriented to person, place, and time.     Coordination: Coordination normal.     (all labs ordered are listed, but only abnormal results are displayed) Labs Reviewed  COMPREHENSIVE METABOLIC PANEL WITH GFR - Abnormal; Notable for the following components:      Result Value   Total Protein 6.0 (*)    All other components within normal limits  LIPASE, BLOOD  CBC  URINALYSIS, ROUTINE W REFLEX MICROSCOPIC    EKG: None  Radiology: No results found.   Procedures   Medications Ordered in the ED - No data to display                                  Medical Decision Making Amount and/or Complexity of Data Reviewed Labs: ordered.   Patient presenting with complaints of abdominal discomfort and loose stools as described in the HPI.  Patient arrives here with stable vital signs and is afebrile.  Physical examination reveals a benign abdomen and is otherwise unremarkable.  Laboratory studies obtained in triage including CBC, CMP, and lipase, all of  which are basically normal.  I suspect some sort of GI illness, likely viral in nature.  His abdomen is benign and I do not feel as though imaging studies are indicated.  He is well-hydrated and do not feel as though he needs IV fluids.  He is tolerating p.o. without difficulty.     Final diagnoses:  None    ED Discharge Orders     None          Geroldine Berg, MD 11/01/23 [phone]

## 2023-11-01 NOTE — ED Notes (Signed)
 ED Provider at bedside.

## 2023-11-04 ENCOUNTER — Other Ambulatory Visit: Payer: Self-pay

## 2023-11-04 ENCOUNTER — Encounter (HOSPITAL_BASED_OUTPATIENT_CLINIC_OR_DEPARTMENT_OTHER): Payer: Self-pay

## 2023-11-04 ENCOUNTER — Emergency Department (HOSPITAL_BASED_OUTPATIENT_CLINIC_OR_DEPARTMENT_OTHER): Admission: EM | Admit: 2023-11-04 | Discharge: 2023-11-04 | Disposition: A | Discharge status: Home / Self Care

## 2023-11-04 DIAGNOSIS — R109 Unspecified abdominal pain: Secondary | ICD-10-CM | POA: Diagnosis present

## 2023-11-04 DIAGNOSIS — Z139 Encounter for screening, unspecified: Secondary | ICD-10-CM | POA: Diagnosis not present

## 2023-11-04 NOTE — Discharge Instructions (Addendum)
 You can take over the counter medications as needed for your symptoms.

## 2023-11-04 NOTE — ED Triage Notes (Signed)
 Patient arrives with officer, security, and is seen by 2 RN and DO in triage. Says he has abdominal pain. Talking about going to [place] , [place] , and lots of flight of ideas.

## 2023-11-05 NOTE — ED Provider Notes (Signed)
 [place] EMERGENCY DEPARTMENT AT [facility] Provider Note   CSN: [id number] Arrival date & time: 11/04/23  2249     Patient presents with: Abdominal Pain   Zachary Stewart is a 33 y.o. male.   33 year old male presents for evaluation of stomach pain.  States he has been traveling and only thinks he may have picked up the floor a stomach virus.  States he has had occasional nausea and vomiting and occasional diarrhea as well.  Patient has a hard time giving me an exact history.  He frequently asks us  to call his mother.  States he takes hydroxyzine  for anxiety and is taking 1 as we evaluate him in the triage room.  He is not very forthcoming with history and is reluctant to allow me to examine him.   Abdominal Pain Associated symptoms: vomiting   Associated symptoms: no chest pain, no chills, no cough, no dysuria, no fever, no hematuria, no shortness of breath and no sore throat        Prior to Admission medications   Medication Sig Start Date End Date Taking? Authorizing Provider  hydrOXYzine  (ATARAX ) 25 MG tablet Take 1 tablet (25 mg total) by mouth every 6 (six) hours as needed. 08/29/23   Paseda, Folashade R, FNP    Allergies: Patient has no known allergies.    Review of Systems  Constitutional:  Negative for chills and fever.  HENT:  Negative for ear pain and sore throat.   Eyes:  Negative for pain and visual disturbance.  Respiratory:  Negative for cough and shortness of breath.   Cardiovascular:  Negative for chest pain and palpitations.  Gastrointestinal:  Positive for abdominal pain and vomiting.  Genitourinary:  Negative for dysuria and hematuria.  Musculoskeletal:  Negative for arthralgias and back pain.  Skin:  Negative for color change and rash.  Neurological:  Negative for seizures and syncope.  All other systems reviewed and are negative.   Updated Vital Signs BP (!) 135/112   Pulse 68   Resp 18   SpO2 100%   Physical Exam Vitals and nursing  note reviewed.  Constitutional:      General: He is not in acute distress.    Appearance: He is well-developed. He is not ill-appearing.  HENT:     Head: Normocephalic and atraumatic.  Eyes:     Conjunctiva/sclera: Conjunctivae normal.  Cardiovascular:     Rate and Rhythm: Normal rate and regular rhythm.     Heart sounds: No murmur heard. Pulmonary:     Effort: Pulmonary effort is normal. No respiratory distress.     Breath sounds: Normal breath sounds.  Abdominal:     Palpations: Abdomen is soft.     Tenderness: There is no abdominal tenderness.  Musculoskeletal:        General: No swelling.     Cervical back: Neck supple.  Skin:    General: Skin is warm and dry.     Capillary Refill: Capillary refill takes less than 2 seconds.  Neurological:     Mental Status: He is alert.  Psychiatric:     Comments: Anxious appearing, patient becomes aggressive and belligerent with staff at times, flight of ideas and has difficulty answering the question directly     (all labs ordered are listed, but only abnormal results are displayed) Labs Reviewed - No data to display  EKG: None  Radiology: No results found.   Procedures   Medications Ordered in the ED - No data to display  Medical Decision Making Social determinants of health: History of anxiety, history of mental health disorders, frequent ER visits  Patient here for what sounds like some abdominal discomfort in the setting of possible viral illness.  He was seen for this recently multiple times.  Patient has flight of ideas and difficulty giving me a good history.  Is not very forthcoming and very reluctant to have an exam.  He appears nontoxic with a benign abdominal exam.  He has become somewhat belligerent and aggressive with staff, making threatening comments.  He does have a prescription for Atarax  with him as well which he states he takes for anxiety.  I had offered him medications for  his symptoms but he declined.  Advised him to pick up some OTC medication and use it as needed for his symptoms. He otherwise appears well I doubt any acute life-threatening etiology for his symptoms and he is advised to follow-up with primary care.  He will be discharged.  Problems Addressed: Encounter for medical screening examination: self-limited or minor problem  Amount and/or Complexity of Data Reviewed External Data Reviewed: notes.    Details: Prior ED records reviewed from a few days ago and patient was here for similar symptoms with a negative workup  Risk OTC drugs. Prescription drug management. Diagnosis or treatment significantly limited by social determinants of health.     Final diagnoses:  Encounter for medical screening examination    ED Discharge Orders     None          Gennaro Duwaine CROME, DO 11/05/23 [phone]
